# Patient Record
Sex: Male | Born: 1955 | Race: Black or African American | Hispanic: No | Smoking: Former smoker
Health system: Southern US, Community
[De-identification: ages and names within clinical notes are randomized; demographics above are authoritative.]

## PROBLEM LIST (undated history)

## (undated) DIAGNOSIS — I10 Essential (primary) hypertension: Secondary | ICD-10-CM

## (undated) DIAGNOSIS — E119 Type 2 diabetes mellitus without complications: Secondary | ICD-10-CM

## (undated) HISTORY — PX: OTHER SURGICAL HISTORY: SHX169

---

## 2003-06-14 ENCOUNTER — Emergency Department (HOSPITAL_COMMUNITY): Admission: EM | Admit: 2003-06-14 | Discharge: 2003-06-14 | Payer: Self-pay | Admitting: Emergency Medicine

## 2005-07-22 ENCOUNTER — Emergency Department (HOSPITAL_COMMUNITY): Admission: EM | Admit: 2005-07-22 | Discharge: 2005-07-22 | Payer: Self-pay | Admitting: Emergency Medicine

## 2006-10-14 ENCOUNTER — Emergency Department (HOSPITAL_COMMUNITY): Admission: EM | Admit: 2006-10-14 | Discharge: 2006-10-14 | Payer: Self-pay | Admitting: Emergency Medicine

## 2007-04-12 ENCOUNTER — Ambulatory Visit: Payer: Self-pay | Admitting: Gastroenterology

## 2007-07-20 ENCOUNTER — Ambulatory Visit (HOSPITAL_COMMUNITY): Admission: RE | Admit: 2007-07-20 | Discharge: 2007-07-20 | Payer: Self-pay | Admitting: Family Medicine

## 2008-09-19 ENCOUNTER — Emergency Department (HOSPITAL_COMMUNITY): Admission: EM | Admit: 2008-09-19 | Discharge: 2008-09-19 | Payer: Self-pay | Admitting: Emergency Medicine

## 2009-07-28 ENCOUNTER — Emergency Department (HOSPITAL_COMMUNITY): Admission: EM | Admit: 2009-07-28 | Discharge: 2009-07-28 | Payer: Self-pay | Admitting: Emergency Medicine

## 2009-12-07 ENCOUNTER — Emergency Department: Payer: Self-pay | Admitting: Emergency Medicine

## 2010-10-13 LAB — BASIC METABOLIC PANEL
BUN: 16 mg/dL (ref 6–23)
CO2: 26 mEq/L (ref 19–32)
Calcium: 9.5 mg/dL (ref 8.4–10.5)
Chloride: 104 mEq/L (ref 96–112)
Creatinine, Ser: 0.98 mg/dL (ref 0.4–1.5)
GFR calc Af Amer: >60 mL/min (ref >60)
GFR calc non Af Amer: >60 mL/min (ref >60)
Glucose, Bld: 138 mg/dL — ABNORMAL HIGH (ref 70–99)
Potassium: 3.2 mEq/L — ABNORMAL LOW (ref 3.5–5.1)
Sodium: 139 mEq/L (ref 135–145)

## 2010-10-13 LAB — POCT CARDIAC MARKERS
CKMB, poc: 1.7 ng/mL (ref 1.0–8.0)
Myoglobin, poc: 47.6 ng/mL (ref 12–200)
Troponin i, poc: <0.05 ng/mL (ref 0.00–0.09)

## 2010-11-12 LAB — BASIC METABOLIC PANEL
BUN: 14 mg/dL (ref 6–23)
CO2: 25 mEq/L (ref 19–32)
Calcium: 9.4 mg/dL (ref 8.4–10.5)
Chloride: 101 mEq/L (ref 96–112)
Creatinine, Ser: 1.04 mg/dL (ref 0.4–1.5)
GFR calc Af Amer: >60 mL/min (ref >60)
GFR calc non Af Amer: >60 mL/min (ref >60)
Glucose, Bld: 109 mg/dL — ABNORMAL HIGH (ref 70–99)
Potassium: 3.3 mEq/L — ABNORMAL LOW (ref 3.5–5.1)
Sodium: 137 mEq/L (ref 135–145)

## 2010-11-12 LAB — CBC
HCT: 41.5 % (ref 39.0–52.0)
Hemoglobin: 13.6 g/dL (ref 13.0–17.0)
MCHC: 32.7 g/dL (ref 30.0–36.0)
MCV: 71.9 fL — ABNORMAL LOW (ref 78.0–100.0)
Platelets: 190 10*3/uL (ref 150–400)
RBC: 5.78 MIL/uL (ref 4.22–5.81)
RDW: 15.1 % (ref 11.5–15.5)
WBC: 7.3 10*3/uL (ref 4.0–10.5)

## 2010-11-12 LAB — POCT CARDIAC MARKERS
CKMB, poc: 1.6 ng/mL (ref 1.0–8.0)
CKMB, poc: 2.4 ng/mL (ref 1.0–8.0)
Myoglobin, poc: 72.9 ng/mL (ref 12–200)
Myoglobin, poc: 91.5 ng/mL (ref 12–200)
Troponin i, poc: <0.05 ng/mL (ref 0.00–0.09)
Troponin i, poc: <0.05 ng/mL (ref 0.00–0.09)

## 2010-11-12 LAB — DIFFERENTIAL
Basophils Absolute: 0 10*3/uL (ref 0.0–0.1)
Basophils Relative: 0 % (ref 0–1)
Eosinophils Absolute: 0.2 10*3/uL (ref 0.0–0.7)
Eosinophils Relative: 3 % (ref 0–5)
Lymphocytes Relative: 40 % (ref 12–46)
Lymphs Abs: 2.9 10*3/uL (ref 0.7–4.0)
Monocytes Absolute: 0.5 10*3/uL (ref 0.1–1.0)
Monocytes Relative: 7 % (ref 3–12)
Neutro Abs: 3.7 10*3/uL (ref 1.7–7.7)
Neutrophils Relative %: 50 % (ref 43–77)

## 2012-10-24 ENCOUNTER — Emergency Department: Payer: Self-pay | Admitting: Emergency Medicine

## 2012-10-24 LAB — CBC WITH DIFFERENTIAL/PLATELET
Basophil #: 0.2 10*3/uL — ABNORMAL HIGH (ref 0.0–0.1)
Basophil %: 2 %
Eosinophil #: 0.2 10*3/uL (ref 0.0–0.7)
Eosinophil %: 1.6 %
HCT: 43.3 % (ref 40.0–52.0)
HGB: 13.9 g/dL (ref 13.0–18.0)
Lymphocyte #: 2.9 10*3/uL (ref 1.0–3.6)
Lymphocyte %: 28.6 %
MCH: 22.9 pg — ABNORMAL LOW (ref 26.0–34.0)
MCHC: 32.1 g/dL (ref 32.0–36.0)
MCV: 71 fL — ABNORMAL LOW (ref 80–100)
Monocyte #: 0.7 x10 3/mm (ref 0.2–1.0)
Monocyte %: 7.2 %
Neutrophil #: 6.1 10*3/uL (ref 1.4–6.5)
Neutrophil %: 60.6 %
Platelet: 192 10*3/uL (ref 150–440)
RBC: 6.06 10*6/uL — ABNORMAL HIGH (ref 4.40–5.90)
RDW: 15.6 % — ABNORMAL HIGH (ref 11.5–14.5)
WBC: 10 10*3/uL (ref 3.8–10.6)

## 2012-10-24 LAB — COMPREHENSIVE METABOLIC PANEL
Albumin: 4 g/dL (ref 3.4–5.0)
Alkaline Phosphatase: 95 U/L (ref 50–136)
Anion Gap: 7 (ref 7–16)
BUN: 18 mg/dL (ref 7–18)
Bilirubin,Total: 0.6 mg/dL (ref 0.2–1.0)
Calcium, Total: 9.5 mg/dL (ref 8.5–10.1)
Chloride: 104 mmol/L (ref 98–107)
Co2: 27 mmol/L (ref 21–32)
Creatinine: 1.24 mg/dL (ref 0.60–1.30)
EGFR (African American): >60
EGFR (Non-African Amer.): >60
Glucose: 122 mg/dL — ABNORMAL HIGH (ref 65–99)
Osmolality: 279 (ref 275–301)
Potassium: 3.6 mmol/L (ref 3.5–5.1)
SGOT(AST): 23 U/L (ref 15–37)
SGPT (ALT): 30 U/L (ref 12–78)
Sodium: 138 mmol/L (ref 136–145)
Total Protein: 8.2 g/dL (ref 6.4–8.2)

## 2012-10-24 LAB — URIC ACID: Uric Acid: 7.7 mg/dL — ABNORMAL HIGH (ref 3.5–7.2)

## 2014-12-19 ENCOUNTER — Emergency Department
Admission: EM | Admit: 2014-12-19 | Discharge: 2014-12-19 | Disposition: A | Discharge status: Home / Self Care | Payer: Self-pay | Attending: Emergency Medicine | Admitting: Emergency Medicine

## 2014-12-19 ENCOUNTER — Emergency Department: Payer: Self-pay

## 2014-12-19 ENCOUNTER — Encounter: Payer: Self-pay | Admitting: *Deleted

## 2014-12-19 DIAGNOSIS — Z87891 Personal history of nicotine dependence: Secondary | ICD-10-CM | POA: Insufficient documentation

## 2014-12-19 DIAGNOSIS — E119 Type 2 diabetes mellitus without complications: Secondary | ICD-10-CM | POA: Insufficient documentation

## 2014-12-19 DIAGNOSIS — I10 Essential (primary) hypertension: Secondary | ICD-10-CM | POA: Insufficient documentation

## 2014-12-19 DIAGNOSIS — M25551 Pain in right hip: Secondary | ICD-10-CM | POA: Insufficient documentation

## 2014-12-19 DIAGNOSIS — R52 Pain, unspecified: Secondary | ICD-10-CM

## 2014-12-19 DIAGNOSIS — M10071 Idiopathic gout, right ankle and foot: Secondary | ICD-10-CM | POA: Insufficient documentation

## 2014-12-19 DIAGNOSIS — M109 Gout, unspecified: Secondary | ICD-10-CM

## 2014-12-19 HISTORY — DX: Essential (primary) hypertension: I10

## 2014-12-19 HISTORY — DX: Type 2 diabetes mellitus without complications: E11.9

## 2014-12-19 LAB — BASIC METABOLIC PANEL
Anion gap: 10 (ref 5–15)
BUN: 13 mg/dL (ref 6–20)
CO2: 26 mmol/L (ref 22–32)
Calcium: 9.3 mg/dL (ref 8.9–10.3)
Chloride: 100 mmol/L — ABNORMAL LOW (ref 101–111)
Creatinine, Ser: 1.13 mg/dL (ref 0.61–1.24)
GFR calc Af Amer: >60 mL/min (ref >60)
GFR calc non Af Amer: >60 mL/min (ref >60)
Glucose, Bld: 108 mg/dL — ABNORMAL HIGH (ref 65–99)
Potassium: 3.5 mmol/L (ref 3.5–5.1)
Sodium: 136 mmol/L (ref 135–145)

## 2014-12-19 LAB — URINALYSIS COMPLETE WITH MICROSCOPIC (ARMC ONLY)
Bacteria, UA: NONE SEEN
Bilirubin Urine: NEGATIVE
Glucose, UA: NEGATIVE mg/dL
Hgb urine dipstick: NEGATIVE
Ketones, ur: NEGATIVE mg/dL
Leukocytes, UA: NEGATIVE
Nitrite: NEGATIVE
Protein, ur: NEGATIVE mg/dL
Specific Gravity, Urine: 1.012 (ref 1.005–1.030)
Squamous Epithelial / LPF: NONE SEEN
pH: 7 (ref 5.0–8.0)

## 2014-12-19 LAB — CBC WITH DIFFERENTIAL/PLATELET
Basophils Absolute: 0.1 10*3/uL (ref 0–0.1)
Basophils Relative: 1 %
Eosinophils Absolute: 0.1 10*3/uL (ref 0–0.7)
Eosinophils Relative: 1 %
HCT: 43.3 % (ref 40.0–52.0)
Hemoglobin: 13.9 g/dL (ref 13.0–18.0)
Lymphocytes Relative: 24 %
Lymphs Abs: 2.7 10*3/uL (ref 1.0–3.6)
MCH: 22.7 pg — ABNORMAL LOW (ref 26.0–34.0)
MCHC: 32 g/dL (ref 32.0–36.0)
MCV: 71 fL — ABNORMAL LOW (ref 80.0–100.0)
Monocytes Absolute: 0.8 10*3/uL (ref 0.2–1.0)
Monocytes Relative: 7 %
Neutro Abs: 7.9 10*3/uL — ABNORMAL HIGH (ref 1.4–6.5)
Neutrophils Relative %: 67 %
Platelets: 160 10*3/uL (ref 150–440)
RBC: 6.1 MIL/uL — ABNORMAL HIGH (ref 4.40–5.90)
RDW: 14.8 % — ABNORMAL HIGH (ref 11.5–14.5)
WBC: 11.6 10*3/uL — ABNORMAL HIGH (ref 3.8–10.6)

## 2014-12-19 LAB — URIC ACID: Uric Acid, Serum: 7.4 mg/dL (ref 4.4–7.6)

## 2014-12-19 MED ORDER — HYDROCODONE-ACETAMINOPHEN 5-325 MG PO TABS: 1.0000 | ORAL_TABLET | ORAL | Status: DC | PRN

## 2014-12-19 MED ORDER — IBUPROFEN 800 MG PO TABS: 800.0000 mg | ORAL_TABLET | Freq: Three times a day (TID) | ORAL | Status: DC | PRN

## 2014-12-19 MED ORDER — KETOROLAC TROMETHAMINE 30 MG/ML IJ SOLN
30.0000 mg | Freq: Once | INTRAMUSCULAR | Status: AC
  Administered 2014-12-19: 30 mg via INTRAVENOUS

## 2014-12-19 MED ORDER — DEXAMETHASONE SODIUM PHOSPHATE 10 MG/ML IJ SOLN
10.0000 mg | Freq: Once | INTRAMUSCULAR | Status: AC
  Administered 2014-12-19: 10 mg via INTRAVENOUS

## 2014-12-19 MED ORDER — COLCHICINE 0.6 MG PO TABS: 0.6000 mg | ORAL_TABLET | Freq: Every day | ORAL | Status: DC

## 2014-12-19 MED ORDER — KETOROLAC TROMETHAMINE 30 MG/ML IJ SOLN
INTRAMUSCULAR | Status: AC
  Administered 2014-12-19: 30 mg via INTRAVENOUS
  Filled 2014-12-19: qty 1

## 2014-12-19 MED ORDER — DEXAMETHASONE SODIUM PHOSPHATE 10 MG/ML IJ SOLN
INTRAMUSCULAR | Status: AC
  Filled 2014-12-19: qty 1

## 2014-12-19 MED ORDER — HYDROMORPHONE HCL 1 MG/ML IJ SOLN
INTRAMUSCULAR | Status: AC
  Administered 2014-12-19: 1 mg via INTRAVENOUS
  Filled 2014-12-19: qty 1

## 2014-12-19 MED ORDER — SODIUM CHLORIDE 0.9 % IV BOLUS (SEPSIS)
1000.0000 mL | Freq: Once | INTRAVENOUS | Status: AC
  Administered 2014-12-19: 1000 mL via INTRAVENOUS

## 2014-12-19 MED ORDER — COLCHICINE 0.6 MG PO TABS
0.6000 mg | ORAL_TABLET | Freq: Once | ORAL | Status: AC
  Administered 2014-12-19: 0.6 mg via ORAL

## 2014-12-19 MED ORDER — COLCHICINE 0.6 MG PO TABS
ORAL_TABLET | ORAL | Status: AC
  Administered 2014-12-19: 0.6 mg via ORAL
  Filled 2014-12-19: qty 1

## 2014-12-19 MED ORDER — HYDROMORPHONE HCL 1 MG/ML IJ SOLN
1.0000 mg | Freq: Once | INTRAMUSCULAR | Status: AC
  Administered 2014-12-19: 1 mg via INTRAVENOUS

## 2014-12-19 NOTE — ED Notes (Signed)
Few days ago developed pain and swelling in right ankle then developed pain in right groin, #plses

## 2014-12-19 NOTE — ED Provider Notes (Signed)
CSN: [id number]     Arrival date & time 12/19/14  [id number] History   First MD Initiated Contact with Patient 12/19/14 1030     Chief Complaint  Patient presents with  . Ankle Pain    few days ago developed pain and swelling to right ankle, then developed pain right groin     (Consider location/radiation/quality/duration/timing/severity/associated sxs/prior Treatment) HPI Patient complaining of gout flareup in his right ankle also having some right hip pain states that he had a lot of meat and some beers this weekend said he started having pain in his ankle tried to stay off of it but it kept swelling and inflaming and is here today for treatment of a gouty flareup rates his pain as a 10 out of 10 any type of movement or touch making it worse holding and steady making it somewhat better denies fevers chills trauma any other symptoms of note Past Medical History  Diagnosis Date  . Hypertension   . Diabetes mellitus without complication    Past Surgical History  Procedure Laterality Date  . Other surgical history      had clogged artery in right leg   No family history on file. History  Substance Use Topics  . Smoking status: Former Games developermoker  . Smokeless tobacco: Not on file  . Alcohol Use: Yes    Review of Systems  Constitutional: Negative.   HENT: Negative.   Eyes: Negative.   Respiratory: Negative.   Cardiovascular: Negative.   Neurological: Negative.   Hematological: Negative.   All other systems reviewed and are negative.     Allergies  Review of patient's allergies indicates no known allergies.  Home Medications   Prior to Admission medications   Medication Sig Start Date End Date Taking? Authorizing Provider  colchicine 0.6 MG tablet Take 1 tablet (0.6 mg total) by mouth daily. 12/19/14 12/19/15  Marcellene Shivley William C Esraa Seres, PA-C  HYDROcodone-acetaminophen (NORCO/VICODIN) 5-325 MG per tablet Take 1 tablet by mouth every 4 (four) hours as needed for moderate pain. 12/19/14    Meshulem Onorato William C Kree Armato, PA-C  ibuprofen (ADVIL,MOTRIN) 800 MG tablet Take 1 tablet (800 mg total) by mouth every 8 (eight) hours as needed. 12/19/14   Nevada Mullett William C Emelina Hinch, PA-C   BP 150/98 mmHg  Pulse 91  Temp(Src) 98.1 F (36.7 C) (Oral)  Resp 16  Ht 5\' 6"  (1.676 m)  Wt 198 lb (89.812 kg)  BMI 31.97 kg/m2  SpO2 99% Physical Exam The male he is appearing stated age well-developed well-nourished lying in bed in some discomfort but no acute distress Vital signs reviewed Head ears eyes nose neck and throat exam were negative pupils equal round reactive light accommodation extraocular motions intact Cardiovascular regular rate and rhythm no murmurs rubs gallops Pulmonary lungs clear to auscultation bilaterally Abdomen soft flat nontender bowel sounds positive in all 4 quadrants Skin otherwise free of rash or disease Neuro exam nonfocal cranial nerves II through XII grossly intact Musculoskeletal patient has pain with palpation to the lateral aspect of his ankle around the distal fibula with mild swelling to that area no redness no sign of any wound good cap refill good pulses throughout the foot pain with external rotation of the leg at the hip without crepitus or palpable deformity or abnormality ED Course  Procedures (including critical care time) Labs Review Labs Reviewed  URINALYSIS COMPLETEWITH MICROSCOPIC [facility])  - Abnormal; Notable for the following:    Color, Urine YELLOW (*)    APPearance CLEAR (*)  All other components within normal limits  CBC WITH DIFFERENTIAL/PLATELET - Abnormal; Notable for the following:    WBC 11.6 (*)    RBC 6.10 (*)    MCV 71.0 (*)    MCH 22.7 (*)    RDW 14.8 (*)    Neutro Abs 7.9 (*)    All other components within normal limits  BASIC METABOLIC PANEL - Abnormal; Notable for the following:    Chloride 100 (*)    Glucose, Bld 108 (*)    All other components within normal limits  URIC ACID    Imaging Review Dg Hip Unilat With Pelvis 2-3  Views Right  12/19/2014   CLINICAL DATA:  Pain in right groin.  No known injury.  EXAM: RIGHT HIP (WITH PELVIS) 2-3 VIEWS  COMPARISON:  None.  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 Findings are symmetric. SI joints are symmetric and unremarkable. No acute bony abnormality. Specifically, no fracture, subluxation, or dislocation. Soft tissues are intact.  IMPRESSION: Mild to moderate symmetric degenerative changes in the hips. No acute bony abnormality.   Electronically Signed   By: Charlett Nose M.D.   On: 12/19/2014 11:15     EKG Interpretation None      MDM  This patient was given colchicine Toradol Dilaudid and Decadron in the department with relief of symptoms is feeling much better x-ray of his hip showed degenerative changes but otherwise no acute abnormality oriented discharge the patient on NSAIDs pain management and culture seen for the next couple days recommended he follow up with his primary care provider to discuss abortive therapy he was also given information on a low purine diet return here for any acute concerns or worsening symptoms Final diagnoses:  Pain  Acute gout of right ankle, unspecified cause        Edmund Holcomb Rosalyn Gess, PA-C 12/19/14 1403

## 2014-12-19 NOTE — ED Notes (Signed)
Pt alert and oriented X4, active, cooperative, pt in NAD. RR even and unlabored, color WNL.  Pt informed to return if any life threatening symptoms occur.  Left with family.  

## 2015-03-09 ENCOUNTER — Emergency Department
Admission: EM | Admit: 2015-03-09 | Discharge: 2015-03-09 | Disposition: A | Discharge status: Home / Self Care | Payer: Self-pay | Attending: Emergency Medicine | Admitting: Emergency Medicine

## 2015-03-09 ENCOUNTER — Encounter: Payer: Self-pay | Admitting: Emergency Medicine

## 2015-03-09 ENCOUNTER — Emergency Department: Payer: Self-pay

## 2015-03-09 DIAGNOSIS — Z87891 Personal history of nicotine dependence: Secondary | ICD-10-CM | POA: Insufficient documentation

## 2015-03-09 DIAGNOSIS — I10 Essential (primary) hypertension: Secondary | ICD-10-CM | POA: Insufficient documentation

## 2015-03-09 DIAGNOSIS — M7918 Myalgia, other site: Secondary | ICD-10-CM

## 2015-03-09 DIAGNOSIS — M545 Low back pain: Secondary | ICD-10-CM | POA: Insufficient documentation

## 2015-03-09 DIAGNOSIS — E119 Type 2 diabetes mellitus without complications: Secondary | ICD-10-CM | POA: Insufficient documentation

## 2015-03-09 MED ORDER — CYCLOBENZAPRINE HCL 10 MG PO TABS: 10.0000 mg | ORAL_TABLET | Freq: Three times a day (TID) | ORAL | Status: DC | PRN

## 2015-03-09 MED ORDER — DIAZEPAM 5 MG/ML IJ SOLN
5.0000 mg | Freq: Once | INTRAMUSCULAR | Status: AC
  Administered 2015-03-09: 5 mg via INTRAMUSCULAR
  Filled 2015-03-09: qty 2

## 2015-03-09 MED ORDER — HYDROCODONE-ACETAMINOPHEN 5-325 MG PO TABS: 1.0000 | ORAL_TABLET | ORAL | Status: DC | PRN

## 2015-03-09 MED ORDER — KETOROLAC TROMETHAMINE 60 MG/2ML IM SOLN
60.0000 mg | Freq: Once | INTRAMUSCULAR | Status: AC
  Administered 2015-03-09: 60 mg via INTRAMUSCULAR
  Filled 2015-03-09: qty 2

## 2015-03-09 MED ORDER — IBUPROFEN 800 MG PO TABS: 800.0000 mg | ORAL_TABLET | Freq: Three times a day (TID) | ORAL | Status: DC | PRN

## 2015-03-09 NOTE — ED Provider Notes (Signed)
[facility] Emergency Department Provider Note  ____________________________________________  Time seen: Approximately 12:34 PM  I have reviewed the triage vital signs and the nursing notes.   HISTORY  Chief Complaint Back Pain   HPI Randy Marsh is a 59 y.o. male who presents for evaluation of low back pain and spasms onset today. Patient states difficult to ambulate at this time. Denies any trauma  Past Medical History  Diagnosis Date  . Hypertension   . Diabetes mellitus without complication     There are no active problems to display for this patient.   Past Surgical History  Procedure Laterality Date  . Other surgical history      had clogged artery in right leg    Current Outpatient Rx  Name  Route  Sig  Dispense  Refill  . cyclobenzaprine (FLEXERIL) 10 MG tablet   Oral   Take 1 tablet (10 mg total) by mouth every 8 (eight) hours as needed for muscle spasms.   30 tablet   1   . HYDROcodone-acetaminophen (NORCO) 5-325 MG per tablet   Oral   Take 1-2 tablets by mouth every 4 (four) hours as needed for moderate pain.   15 tablet   0   . ibuprofen (ADVIL,MOTRIN) 800 MG tablet   Oral   Take 1 tablet (800 mg total) by mouth every 8 (eight) hours as needed.   30 tablet   0     Allergies Review of patient's allergies indicates no known allergies.  History reviewed. No pertinent family history.  Social History Social History  Substance Use Topics  . Smoking status: Former Games developer  . Smokeless tobacco: None  . Alcohol Use: Yes    Review of Systems Constitutional: No fever/chills Eyes: No visual changes. ENT: No sore throat. Cardiovascular: Denies chest pain. Respiratory: Denies shortness of breath. Gastrointestinal: No abdominal pain.  No nausea, no vomiting.  No diarrhea.  No constipation. Genitourinary: Negative for dysuria. Musculoskeletal: Positive for low back pain Skin: Negative for rash. Neurological: Negative  for headaches, focal weakness or numbness.  10-point ROS otherwise negative.  ____________________________________________   PHYSICAL EXAM:  VITAL SIGNS: ED Triage Vitals  Enc Vitals Group     BP 03/09/15 1123 158/98 mmHg     Pulse Rate 03/09/15 1123 85     Resp 03/09/15 1123 18     Temp 03/09/15 1123 98.2 F (36.8 C)     Temp Source 03/09/15 1123 Oral     SpO2 03/09/15 1123 98 %     Weight 03/09/15 1123 187 lb (84.823 kg)     Height 03/09/15 1123 5\' 6"  (1.676 m)     Head Cir --      Peak Flow --      Pain Score 03/09/15 1121 10     Pain Loc --      Pain Edu? --      Excl. in GC? --     Constitutional: Alert and oriented. Well appearing and in no acute distress. Eyes: Conjunctivae are normal. PERRL. EOMI. Head: Atraumatic. Nose: No congestion/rhinnorhea. Mouth/Throat: Mucous membranes are moist.  Oropharynx non-erythematous. Neck: No stridor.   Cardiovascular: Normal rate, regular rhythm. Grossly normal heart sounds.  Good peripheral circulation. Respiratory: Normal respiratory effort.  No retractions. Lungs CTAB. Gastrointestinal: Soft and nontender. No distention. No abdominal bruits. No CVA tenderness. Musculoskeletal: No lower extremity tenderness nor edema.  No joint effusions. Positive straight leg raise 30 bilaterally increased pain with flexion and extension. Point  tenderness paraspinal muscles and lumbar spine Neurologic:  Normal speech and language. No gross focal neurologic deficits are appreciated. No gait instability. Skin:  Skin is warm, dry and intact. No rash noted. Psychiatric: Mood and affect are normal. Speech and behavior are normal.  ____________________________________________   LABS (all labs ordered are listed, but only abnormal results are displayed)  Labs Reviewed - No data to display ____________________________________________   RADIOLOGY  Negative interpreted by radiologist and reviewed by  myself. ____________________________________________   PROCEDURES  Procedure(s) performed: None  Critical Care performed: No  ____________________________________________   INITIAL IMPRESSION / ASSESSMENT AND PLAN / ED COURSE  Pertinent labs & imaging results that were available during my care of the patient were reviewed by me and considered in my medical decision making (see chart for details).  Diagnosed with acute lumbar myofascial strain. Rx given for Motrin 800 mg 3 times a day and Flexeril 10 mg 3 times a day. Take all medications as directed continue using heating pad and rest. Patient voices no other emergency medical complaints at this time and will return to the ER with any worsening symptomology. ____________________________________________   FINAL CLINICAL IMPRESSION(S) / ED DIAGNOSES  Final diagnoses:  Lumbar muscle pain      Evangeline Dakin, PA-C 03/09/15 1446  Sharyn Creamer, MD 03/09/15 1549

## 2015-03-09 NOTE — ED Notes (Signed)
Pt to ed with c/o lower back pain, denies injury, reports hx of same,  States ambulation is difficult.

## 2015-03-09 NOTE — Discharge Instructions (Signed)

## 2017-08-18 ENCOUNTER — Encounter: Payer: Self-pay | Admitting: *Deleted

## 2017-08-18 ENCOUNTER — Ambulatory Visit (INDEPENDENT_AMBULATORY_CARE_PROVIDER_SITE_OTHER): Payer: Self-pay

## 2017-08-18 ENCOUNTER — Ambulatory Visit
Admission: EM | Admit: 2017-08-18 | Discharge: 2017-08-18 | Disposition: A | Discharge status: Home / Self Care | Payer: Self-pay | Attending: Family Medicine | Admitting: Family Medicine

## 2017-08-18 ENCOUNTER — Other Ambulatory Visit: Payer: Self-pay

## 2017-08-18 DIAGNOSIS — M778 Other enthesopathies, not elsewhere classified: Secondary | ICD-10-CM

## 2017-08-18 DIAGNOSIS — M25531 Pain in right wrist: Secondary | ICD-10-CM

## 2017-08-18 DIAGNOSIS — M79631 Pain in right forearm: Secondary | ICD-10-CM

## 2017-08-18 MED ORDER — PREDNISONE 20 MG PO TABS: 20.0000 mg | ORAL_TABLET | Freq: Every day | ORAL | 0 refills | Status: DC

## 2017-08-18 MED ORDER — ACETAMINOPHEN 500 MG PO TABS
1000.0000 mg | ORAL_TABLET | Freq: Once | ORAL | Status: AC
  Administered 2017-08-18: 1000 mg via ORAL

## 2017-08-18 MED ORDER — CLONIDINE HCL 0.1 MG PO TABS
0.1000 mg | ORAL_TABLET | Freq: Once | ORAL | Status: AC
  Administered 2017-08-18: 0.1 mg via ORAL

## 2017-08-18 MED ORDER — HYDROCODONE-ACETAMINOPHEN 5-325 MG PO TABS: ORAL_TABLET | ORAL | 0 refills | Status: DC

## 2017-08-18 NOTE — ED Provider Notes (Signed)
[facility]    CSN: [id number] Arrival date & time: 08/18/17  [id number]     History   Chief Complaint Chief Complaint  Patient presents with  . Wrist Injury    HPI Randy Marsh is a 62 y.o. male.   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 Denies any falls or direct trauma, however states he was holding the heavy transmission with wrist extended for a prolong period of time.    The history is provided by the patient.    Past Medical History:  Diagnosis Date  . Diabetes mellitus without complication (HCC)   . Hypertension     There are no active problems to display for this patient.   Past Surgical History:  Procedure Laterality Date  . OTHER SURGICAL HISTORY     had clogged artery in right leg       Home Medications    Prior to Admission medications   Medication Sig Start Date End Date Taking? Authorizing Provider  cyclobenzaprine (FLEXERIL) 10 MG tablet Take 1 tablet (10 mg total) by mouth every 8 (eight) hours as needed for muscle spasms. 03/09/15   Beers, Charmayne Sheerharles M, PA-C  HYDROcodone-acetaminophen (NORCO/VICODIN) 5-325 MG tablet 1-2 tabs po q 8 hours prn 08/18/17   Payton Mccallumonty, Kairen Hallinan, MD  ibuprofen (ADVIL,MOTRIN) 800 MG tablet Take 1 tablet (800 mg total) by mouth every 8 (eight) hours as needed. 03/09/15   Beers, Charmayne Sheerharles M, PA-C  predniSONE (DELTASONE) 20 MG tablet Take 1 tablet (20 mg total) by mouth daily. 08/18/17   Payton Mccallumonty, Wali Reinheimer, MD    Family History Family History  Problem Relation Age of Onset  . Cancer Father     Social History Social History   Tobacco Use  . Smoking status: Former Games developermoker  . Smokeless tobacco: Never Used  Substance Use Topics  . Alcohol use: Yes  . Drug use: No     Allergies   Patient has no known allergies.   Review of Systems Review of Systems   Physical Exam Triage Vital Signs ED Triage Vitals  Enc Vitals Group     BP 08/18/17  0918 (!) 196/125     Pulse Rate 08/18/17 0918 82     Resp 08/18/17 0918 20     Temp 08/18/17 0918 97.8 F (36.6 C)     Temp Source 08/18/17 0918 Oral     SpO2 08/18/17 0918 99 %     Weight 08/18/17 0919 198 lb (89.8 kg)     Height 08/18/17 0919 5\' 6"  (1.676 m)     Head Circumference --      Peak Flow --      Pain Score 08/18/17 0918 10     Pain Loc --      Pain Edu? --      Excl. in GC? --    No data found.  Updated Vital Signs BP (!) 186/113 (BP Location: Left Arm) Comment: Dr Judd Gaudieronty notified  Pulse 80   Temp 97.8 F (36.6 C) (Oral)   Resp 20   Ht 5\' 6"  (1.676 m)   Wt 198 lb (89.8 kg)   SpO2 99%   BMI 31.96 kg/m   Visual Acuity Right Eye Distance:   Left Eye Distance:   Bilateral Distance:    Right Eye Near:   Left Eye Near:    Bilateral Near:     Physical Exam  Constitutional: He appears  well-developed and well-nourished. No distress.  Musculoskeletal:       Right hand: He exhibits decreased range of motion, tenderness and swelling. He exhibits normal two-point discrimination, normal capillary refill, no deformity and no laceration. Normal sensation noted. Normal strength noted.  Skin: He is not diaphoretic.  Nursing note and vitals reviewed.    UC Treatments / Results  Labs (all labs ordered are listed, but only abnormal results are displayed) Labs Reviewed - No data to display  EKG  EKG Interpretation None       Radiology Dg Wrist Complete Right  Result Date: 08/18/2017 CLINICAL DATA:  Right wrist pain EXAM: RIGHT WRIST - COMPLETE 3+ VIEW COMPARISON:  None. FINDINGS: No fracture or dislocation is seen. Degenerative changes of the radiocarpal joint, including subchondral cystic changes. Calcifications of the TFCC. Mild diffuse soft tissue swelling. IMPRESSION: Degenerative changes at the radiocarpal joint. No fracture or dislocation is seen. Electronically Signed   By: Charline Bills M.D.   On: 08/18/2017 10:04    Procedures Procedures  (including critical care time)  Medications Ordered in UC Medications  acetaminophen (TYLENOL) tablet 1,000 mg (1,000 mg Oral Given 08/18/17 0931)  cloNIDine (CATAPRES) tablet 0.1 mg (0.1 mg Oral Given 08/18/17 1028)     Initial Impression / Assessment and Plan / UC Course  I have reviewed the triage vital signs and the nursing notes.  Pertinent labs & imaging results that were available during my care of the patient were reviewed by me and considered in my medical decision making (see chart for details).        Final Clinical Impressions(s) / UC Diagnoses   Final diagnoses:  Tendinitis of right wrist    ED Discharge Orders        Ordered    predniSONE (DELTASONE) 20 MG tablet  Daily     08/18/17 1025    HYDROcodone-acetaminophen (NORCO/VICODIN) 5-325 MG tablet     08/18/17 1025     1. x-ray results and diagnosis reviewed with patient 2. rx as per orders above; reviewed possible side effects, interactions, risks and benefits  3. Recommend supportive treatment with rest, ice, otc acetaminophen 4. Follow-up prn if symptoms worsen or don't improve  Controlled Substance Prescriptions [place] Controlled Substance Registry consulted? Not Applicable   Payton Mccallum, MD 08/18/17 1328

## 2017-08-18 NOTE — ED Triage Notes (Signed)
Patient started having right wrist pain 5 days ago after assisting friend with installing a transmission. Deformity is visible posterior right wrist. Patient reports breaking right wrist when he was in [employer].

## 2017-08-21 ENCOUNTER — Telehealth: Payer: Self-pay | Admitting: *Deleted

## 2017-09-14 ENCOUNTER — Other Ambulatory Visit: Payer: Self-pay

## 2017-09-14 ENCOUNTER — Encounter: Payer: Self-pay | Admitting: Emergency Medicine

## 2017-09-14 ENCOUNTER — Emergency Department
Admission: EM | Admit: 2017-09-14 | Discharge: 2017-09-14 | Disposition: A | Discharge status: Home / Self Care | Payer: Self-pay | Attending: Emergency Medicine | Admitting: Emergency Medicine

## 2017-09-14 ENCOUNTER — Emergency Department: Payer: Self-pay

## 2017-09-14 DIAGNOSIS — Y929 Unspecified place or not applicable: Secondary | ICD-10-CM | POA: Insufficient documentation

## 2017-09-14 DIAGNOSIS — E119 Type 2 diabetes mellitus without complications: Secondary | ICD-10-CM | POA: Insufficient documentation

## 2017-09-14 DIAGNOSIS — Y999 Unspecified external cause status: Secondary | ICD-10-CM | POA: Insufficient documentation

## 2017-09-14 DIAGNOSIS — S9032XA Contusion of left foot, initial encounter: Secondary | ICD-10-CM | POA: Insufficient documentation

## 2017-09-14 DIAGNOSIS — Z87891 Personal history of nicotine dependence: Secondary | ICD-10-CM | POA: Insufficient documentation

## 2017-09-14 DIAGNOSIS — I10 Essential (primary) hypertension: Secondary | ICD-10-CM | POA: Insufficient documentation

## 2017-09-14 DIAGNOSIS — Y939 Activity, unspecified: Secondary | ICD-10-CM | POA: Insufficient documentation

## 2017-09-14 DIAGNOSIS — X58XXXA Exposure to other specified factors, initial encounter: Secondary | ICD-10-CM | POA: Insufficient documentation

## 2017-09-14 MED ORDER — IBUPROFEN 600 MG PO TABS: 600.0000 mg | ORAL_TABLET | Freq: Three times a day (TID) | ORAL | 0 refills | Status: DC | PRN

## 2017-09-14 MED ORDER — OXYCODONE-ACETAMINOPHEN 5-325 MG PO TABS
1.0000 | ORAL_TABLET | Freq: Once | ORAL | Status: AC
  Administered 2017-09-14: 1 via ORAL
  Filled 2017-09-14: qty 1

## 2017-09-14 MED ORDER — OXYCODONE-ACETAMINOPHEN 7.5-325 MG PO TABS: 1.0000 | ORAL_TABLET | Freq: Four times a day (QID) | ORAL | 0 refills | Status: DC | PRN

## 2017-09-14 MED ORDER — IBUPROFEN 800 MG PO TABS
800.0000 mg | ORAL_TABLET | Freq: Once | ORAL | Status: AC
  Administered 2017-09-14: 800 mg via ORAL
  Filled 2017-09-14: qty 1
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
## 2020-02-15 ENCOUNTER — Ambulatory Visit
Admission: RE | Admit: 2020-02-15 | Discharge: 2020-02-15 | Disposition: A | Discharge status: Home / Self Care | Payer: Medicare Other | Source: Ambulatory Visit | Attending: Family Medicine | Admitting: Family Medicine

## 2020-02-15 ENCOUNTER — Ambulatory Visit
Admission: RE | Admit: 2020-02-15 | Discharge: 2020-02-15 | Disposition: A | Discharge status: Home / Self Care | Payer: Medicare Other | Attending: Family Medicine | Admitting: Family Medicine

## 2020-02-15 ENCOUNTER — Other Ambulatory Visit: Payer: Self-pay | Admitting: Family Medicine

## 2020-02-15 DIAGNOSIS — M25562 Pain in left knee: Secondary | ICD-10-CM

## 2020-07-12 DIAGNOSIS — M1712 Unilateral primary osteoarthritis, left knee: Secondary | ICD-10-CM | POA: Insufficient documentation

## 2021-04-04 ENCOUNTER — Emergency Department
Admission: EM | Admit: 2021-04-04 | Discharge: 2021-04-04 | Disposition: A | Discharge status: Home / Self Care | Payer: Medicare Other | Attending: Emergency Medicine | Admitting: Emergency Medicine

## 2021-04-04 ENCOUNTER — Other Ambulatory Visit: Payer: Self-pay

## 2021-04-04 DIAGNOSIS — E119 Type 2 diabetes mellitus without complications: Secondary | ICD-10-CM | POA: Insufficient documentation

## 2021-04-04 DIAGNOSIS — Z7984 Long term (current) use of oral hypoglycemic drugs: Secondary | ICD-10-CM | POA: Insufficient documentation

## 2021-04-04 DIAGNOSIS — M25462 Effusion, left knee: Secondary | ICD-10-CM

## 2021-04-04 DIAGNOSIS — M25562 Pain in left knee: Secondary | ICD-10-CM | POA: Diagnosis present

## 2021-04-04 DIAGNOSIS — Z87891 Personal history of nicotine dependence: Secondary | ICD-10-CM | POA: Insufficient documentation

## 2021-04-04 DIAGNOSIS — M1712 Unilateral primary osteoarthritis, left knee: Secondary | ICD-10-CM | POA: Diagnosis not present

## 2021-04-04 DIAGNOSIS — I10 Essential (primary) hypertension: Secondary | ICD-10-CM | POA: Diagnosis not present

## 2021-04-04 DIAGNOSIS — Z96652 Presence of left artificial knee joint: Secondary | ICD-10-CM | POA: Insufficient documentation

## 2021-04-04 DIAGNOSIS — Z79899 Other long term (current) drug therapy: Secondary | ICD-10-CM | POA: Insufficient documentation

## 2021-04-04 MED ORDER — HYDROCODONE-ACETAMINOPHEN 5-325 MG PO TABS
1.0000 | ORAL_TABLET | Freq: Once | ORAL | Status: AC
  Administered 2021-04-04: 1 via ORAL
  Filled 2021-04-04: qty 1

## 2021-04-04 MED ORDER — HYDROCODONE-ACETAMINOPHEN 5-325 MG PO TABS: 1.0000 | ORAL_TABLET | Freq: Three times a day (TID) | ORAL | 0 refills | Status: DC | PRN

## 2021-04-04 MED ORDER — NABUMETONE 750 MG PO TABS: 750.0000 mg | ORAL_TABLET | Freq: Two times a day (BID) | ORAL | 0 refills | Status: AC

## 2021-04-04 NOTE — ED Notes (Addendum)
See triage note  presents with swelling to left knee  denies any recent injury    states his surgery is scheduled for this month at [facility]

## 2021-04-04 NOTE — ED Notes (Signed)
Patient stable and discharged with all personal belongings and AVS. AVS and discharge instructions reviewed with patient and opportunity for questions provided.   Walker provided to patient and adjusted to height per MD order.

## 2021-04-04 NOTE — ED Provider Notes (Signed)
[facility] Emergency Department Provider Note ____________________________________________  Time seen: 1123  I have reviewed the triage vital signs and the nursing notes.  HISTORY  Chief Complaint  Leg Swelling   HPI Randy Marsh is a 65 y.o. male with below medical history, presents to the ED for evaluation of acute on chronic left knee pain.  Patient with a history of significant tricompartmental osteoarthritis of the left knee, presents with acute swelling.  Patient is scheduled for a total knee replacement in about 3 weeks.  Denies any preceding injury, trauma, or fall.  He notes acute swelling to the joint last night.  He has taken Tylenol and ibuprofen with limited benefit.  He presents to the ED requesting management of his pain.  Past Medical History:  Diagnosis Date   Diabetes mellitus without complication (HCC)    Hypertension     There are no problems to display for this patient.   Past Surgical History:  Procedure Laterality Date   OTHER SURGICAL HISTORY     had clogged artery in right leg    Prior to Admission medications   Medication Sig Start Date End Date Taking? Authorizing Provider  amLODipine (NORVASC) 10 MG tablet Take 10 mg by mouth daily.   Yes [provider]  atorvastatin (LIPITOR) 40 MG tablet Take 40 mg by mouth daily.   Yes [provider]  HYDROcodone-acetaminophen (NORCO) 5-325 MG tablet Take 1 tablet by mouth 3 (three) times daily as needed. 04/04/21  Yes Artia Singley, Charlesetta Ivory, PA-C  lisinopril (ZESTRIL) 20 MG tablet Take 20 mg by mouth daily.   Yes [provider]  metFORMIN (GLUCOPHAGE) 500 MG tablet Take by mouth 2 (two) times daily with a meal.   Yes [provider]  nabumetone (RELAFEN) 750 MG tablet Take 1 tablet (750 mg total) by mouth 2 (two) times daily for 15 days. 04/04/21 04/19/21 Yes Devlyn Parish, Charlesetta Ivory, PA-C    Allergies Patient has no known allergies.  Family History   Problem Relation Age of Onset   Cancer Father     Social History Social History   Tobacco Use   Smoking status: Former   Smokeless tobacco: Never  Building services engineer Use: Never used  Substance Use Topics   Alcohol use: Yes   Drug use: No    Review of Systems  Constitutional: Negative for fever. Cardiovascular: Negative for chest pain. Respiratory: Negative for shortness of breath. Gastrointestinal: Negative for abdominal pain, vomiting and diarrhea. Genitourinary: Negative for dysuria. Musculoskeletal: Negative for back pain.  Left knee pain as above. Skin: Negative for rash. Neurological: Negative for headaches, focal weakness or numbness. ____________________________________________  PHYSICAL EXAM:  VITAL SIGNS: ED Triage Vitals [04/04/21 1105]  Enc Vitals Group     BP (!) 120/99     Pulse Rate 83     Resp 20     Temp 98.2 F (36.8 C)     Temp Source Oral     SpO2 98 %     Weight 197 lb (89.4 kg)     Height 5\' 6"  (1.676 m)     Head Circumference      Peak Flow      Pain Score 9     Pain Loc      Pain Edu?      Excl. in GC?     Constitutional: Alert and oriented. Well appearing and in no distress. Head: Normocephalic and atraumatic. Eyes: Conjunctivae are normal. Normal  extraocular movements Cardiovascular: Normal rate, regular rhythm. Normal distal pulses. Respiratory: Normal respiratory effort. No wheezes/rales/rhonchi. Musculoskeletal: Left knee with moderate joint effusion appreciated.  Patient with decreased range of motion secondary to joint effusion.  No popliteal space fullness is appreciated.  No calf or Achilles tenderness noted distally.  Nontender with normal range of motion in all extremities.  Neurologic: Antalgic gait without ataxia. Normal speech and language. No gross focal neurologic deficits are appreciated. Skin:  Skin is warm, dry and intact. No rash noted. Psychiatric: Mood and affect are normal. Patient exhibits appropriate insight  and judgment. ____________________________________________    {LABS (pertinent positives/negatives)  ____________________________________________  {EKG  ____________________________________________   RADIOLOGY Official radiology report(s): No results found. ____________________________________________  PROCEDURES  Norco 5-325 mg PO Standard walker  Procedures ____________________________________________   INITIAL IMPRESSION / ASSESSMENT AND PLAN / ED COURSE  As part of my medical decision making, I reviewed the following data within the electronic MEDICAL RECORD NUMBER Old chart reviewed, Notes from prior ED visits, and [place] Controlled Substance Database   Patient with significant left knee DJD, presents with an acute effusion.  Patient without any preceding injury,, presents with acute on chronic knee pain.  He scheduled for total knee replacement in 3 weeks.  Patient requested, but was declined a joint aspiration given the increased risks prior to his planned surgery.  He is discharged at this time with a prescription pain medicine as well as an anti-inflammatory.  He should follow with primary provider for ongoing symptom management.  Work was also provided for his ambulatory benefit.  He should discontinue all anti-inflammatories 3 to 5 days prior to his scheduled surgery.  Return precautions have been reviewed.    Randy Marsh was evaluated in Emergency Department on 04/04/2021 for the symptoms described in the history of present illness. He was evaluated in the context of the global COVID-19 pandemic, which necessitated consideration that the patient might be at risk for infection with the SARS-CoV-2 virus that causes COVID-19. Institutional protocols and algorithms that pertain to the evaluation of patients at risk for COVID-19 are in a state of rapid change based on information released by regulatory bodies including the CDC and federal and state organizations. These policies and  algorithms were followed during the patient's care in the ED.  I reviewed the patient's prescription history over the last 12 months in the multi-state controlled substances database(s) that includes [place], [place], [place], [place], [place], [place], [place], [place], [place], [place], [place], [place], [place], and [place].  Results were notable for no current RX noted.  ____________________________________________  FINAL CLINICAL IMPRESSION(S) / ED DIAGNOSES  Final diagnoses:  Primary osteoarthritis of left knee  Knee effusion, left      Tanelle Lanzo, Charlesetta Ivory, PA-C 04/04/21 1209    Sharman Cheek, MD 04/04/21 1534

## 2021-04-04 NOTE — ED Triage Notes (Signed)
Pt to ED for swelling to left knee. States he is supposed to have surgery later in the month for knee replacement. Denies injury

## 2021-04-04 NOTE — Discharge Instructions (Addendum)
Take the prescription pain medicine as needed, and the anti-inflammatory as prescribed.  You will have to discontinue your anti-inflammatory 3 to 5 days before your scheduled surgery.  Follow-up with your primary provider or orthopedic specialist for ongoing symptoms.  Rest with the leg elevated and apply ice to reduce swelling.  Return to the ED if needed.

## 2021-06-04 ENCOUNTER — Ambulatory Visit (HOSPITAL_COMMUNITY): Payer: Medicare Other | Attending: Orthopaedic Surgery

## 2021-06-04 ENCOUNTER — Other Ambulatory Visit: Payer: Self-pay

## 2021-06-04 VITALS — BP 115/71 | HR 70 | Temp 98.6°F | Resp 17

## 2021-06-04 DIAGNOSIS — M6281 Muscle weakness (generalized): Secondary | ICD-10-CM

## 2021-06-04 DIAGNOSIS — R2681 Unsteadiness on feet: Secondary | ICD-10-CM | POA: Diagnosis present

## 2021-06-04 NOTE — Therapy (Signed)
[facility] [address] [place], [place], [address] Phone: [phone]   Fax:  [phone]  Physical Therapy Treatment  Patient Details  Name: Randy Marsh MRN: [id number] Date of Birth: 1956/01/19 Referring Provider (PT): Marianna Fuss, MD   Encounter Date: 06/04/2021   PT End of Session - 06/04/21 0942     Visit Number 1    Number of Visits 4    Date for PT Re-Evaluation 07/02/21    Authorization Type UHC Medicare, no auth/VL    PT Start Time 0945    PT Stop Time 1030    PT Time Calculation (min) 45 min    Activity Tolerance Patient tolerated treatment well    Behavior During Therapy [facility] for tasks assessed/performed             Past Medical History:  Diagnosis Date   Diabetes mellitus without complication (HCC)    Hypertension     Past Surgical History:  Procedure Laterality Date   OTHER SURGICAL HISTORY     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   Subjective Assessment - 06/04/21 0944     Subjective Left TKR on 04/24/21 and had been home health and had been going to outpatient therapy in [place] since. Pt was D/C from that outpatient clinic yesterday and rpesents today for continued POC.    Currently in Pain? No/denies                [facility] PT Assessment - 06/04/21 0001       Assessment   Medical Diagnosis left TKR    Referring Provider (PT) Marianna Fuss, MD    Onset Date/Surgical Date 04/24/21    Next MD Visit 06/2021      Balance Screen   Has the patient fallen in the past 6 months No    Has the patient had a decrease in activity level because of a fear of falling?  No    Is the patient reluctant to leave their home because of a fear of falling?  No      Home Environment   Living Environment Private residence    Living Arrangements Alone    Type of Home Mobile home    Home Access Level entry    Home Layout One level    Home  Equipment [place] - single point      Prior Function   Level of Independence Independent    Vocation Retired    Leisure Museum/gallery exhibitions officer tests Single leg stance      Single Leg Stance   Comments LLE: 12 sec      ROM / Strength   AROM / PROM / Strength AROM;Strength      AROM   AROM Assessment Site Knee    Right/Left Knee Left    Left Knee Extension 3   lacking, springy end-feel   Left Knee Flexion 118      Strength   Overall Strength Deficits    Overall Strength Comments 3-5 degree extensor lag    Strength Assessment Site Knee    Right/Left Knee Left    Left Knee Flexion 4/5    Left Knee Extension 4-/5      Transfers   Five time sit to stand comments  9 sec      Ambulation/Gait   Ambulation/Gait Yes  Ambulation/Gait Assistance 7: Independent    Ambulation Distance (Feet) 350 Feet    Assistive device None    Gait Pattern Lateral trunk lean to left    Ambulation Surface Level;Indoor    Gait Comments      Standardized Balance Assessment   Standardized Balance Assessment Dynamic Gait Index      Dynamic Gait Index   Level Surface Mild Impairment    Change in Gait Speed Mild Impairment    Gait with Horizontal Head Turns Mild Impairment    Gait with Vertical Head Turns Mild Impairment    Gait and Pivot Turn Moderate Impairment    Step Over Obstacle Moderate Impairment    Step Around Obstacles Mild Impairment    Steps Moderate Impairment    Total Score 13                                    PT Education - 06/04/21 1026     Education Details discussion regarding CLOF and benefit of progressing program for more robust strengthening and dynamic balance to improve ability to ambulate uneven surfaces, stairs, etc    Person(s) Educated Patient    Methods Explanation    Comprehension Verbalized understanding              PT Short Term Goals - 06/04/21 1031       PT SHORT TERM GOAL #1   Title Patient will  be independent with HEP in order to improve functional outcomes.    Time 2    Period Weeks    Status New    Target Date 06/18/21      PT SHORT TERM GOAL #2   Title Demo improved balance/gait abilities as evidenced by score 19/24 to reduce risk for falls    Baseline 13/24    Time 2    Period Weeks    Status New    Target Date 06/18/21               PT Long Term Goals - 06/04/21 1032       PT LONG TERM GOAL #1   Title Demo improved LLE strength as evident by no extensor lag with SLR    Baseline 3-5 degree lag    Time 4    Period Weeks    Status New    Target Date 07/02/21      PT LONG TERM GOAL #2   Title ambulate independently with normalized gait pattern and reciprocal stair ambulation    Time 4    Period Weeks    Status New    Target Date 07/02/21                   Plan - 06/04/21 1027     Clinical Impression Statement Pt is 65 yo man with hx of left TKR and is overall doing very well but continues to demonstrate left quad weakness and gait deviations with difficulty in manuevering/negotiating obstacles and would benefit from continued PT services to improve LE strength and improve dynamic balance to reduce risk for falls during his usual activities which include trekking/hunting in the woods    Personal Factors and Comorbidities Time since onset of injury/illness/exacerbation    Examination-Activity Limitations Stairs;Squat;Locomotion Level    Examination-Participation Restrictions Yard Work;Other   unable to participate in his usual outdoors activities due to balance/gait deviations   Stability/Clinical Decision Making Stable/Uncomplicated  Clinical Decision Making Low    Rehab Potential Excellent    PT Frequency 1x / week    PT Duration 4 weeks    PT Treatment/Interventions ADLs/Self Care Home Management;Electrical Stimulation;Gait training;Stair training;Therapeutic activities;Therapeutic exercise;Balance training;Patient/family  education;Neuromuscular re-education;Manual techniques;Taping    PT Next Visit Plan standing/WBing strengthening exercises, dynamic standing balance activities    PT Home Exercise Plan continue with previous PT POC LE ther ex    Consulted and Agree with Plan of Care Patient             Patient will benefit from skilled therapeutic intervention in order to improve the following deficits and impairments:  Abnormal gait, Decreased balance, Decreased strength, Difficulty walking  Visit Diagnosis: Muscle weakness (generalized)  Unsteadiness on feet     Problem List There are no problems to display for this patient.   Dion Body, PT 06/04/2021, 10:34 AM  [facility] [address] [place], [place], [address] Phone: [phone]   Fax:  [phone]  Name: Randy Marsh MRN: [id number] Date of Birth: 05-Aug-1955

## 2021-06-11 ENCOUNTER — Ambulatory Visit (HOSPITAL_COMMUNITY): Payer: Medicare Other | Admitting: Physical Therapy

## 2021-06-12 ENCOUNTER — Ambulatory Visit (HOSPITAL_COMMUNITY): Payer: Medicare Other | Admitting: Physical Therapy

## 2021-06-12 ENCOUNTER — Encounter (HOSPITAL_COMMUNITY): Payer: Self-pay | Admitting: Physical Therapy

## 2021-06-12 ENCOUNTER — Other Ambulatory Visit: Payer: Self-pay

## 2021-06-12 DIAGNOSIS — R2681 Unsteadiness on feet: Secondary | ICD-10-CM

## 2021-06-12 DIAGNOSIS — M6281 Muscle weakness (generalized): Secondary | ICD-10-CM

## 2021-06-12 NOTE — Patient Instructions (Signed)
Access Code: [email or web address] URL: [email or web address] Date: 06/12/2021 Prepared by: Greig Castilla Jevin Camino  Exercises Lateral Step Down - 1 x daily - 7 x weekly - 3 sets - 10 reps Hip Abduction with Resistance Loop - 1 x daily - 7 x weekly - 3 sets - 10 reps

## 2021-06-12 NOTE — Therapy (Signed)
[facility] [address] [place], [place], [address] Phone: [phone]   Fax:  [phone]  Physical Therapy Treatment  Patient Details  Name: Randy Marsh MRN: [id number] Date of Birth: 03/13/1956 Referring Provider (PT): Marianna Fuss, MD   Encounter Date: 06/12/2021   PT End of Session - 06/12/21 1312     Visit Number 2    Number of Visits 4    Date for PT Re-Evaluation 07/02/21    Authorization Type UHC Medicare, no auth/VL    PT Start Time 1313    PT Stop Time 1352    PT Time Calculation (min) 39 min    Activity Tolerance Patient tolerated treatment well    Behavior During Therapy [place] [facility] for tasks assessed/performed             Past Medical History:  Diagnosis Date   Diabetes mellitus without complication (HCC)    Hypertension     Past Surgical History:  Procedure Laterality Date   OTHER SURGICAL HISTORY     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   Subjective Assessment - 06/12/21 1313     Subjective Patient states some pain in the knee today. he tries to get up and walk a little every day.    Currently in Pain? No/denies                               [facility] Adult PT Treatment/Exercise - 06/12/21 0001       Exercises   Exercises Knee/Hip      Knee/Hip Exercises: Standing   Heel Raises 20 reps    Terminal Knee Extension Left;10 reps    Terminal Knee Extension Limitations 5 plates 4-16  second holds    Hip Abduction Both;2 sets;15 reps    Abduction Limitations green band at knees    Lateral Step Up Left;2 sets;10 reps;Hand Hold: 1;Step Height: 6"    Lateral Step Up Limitations eccentric control    Forward Step Up Left;2 sets;10 reps;Hand Hold: 1;Step Height: 4";Step Height: 6"    Forward Step Up Limitations 2 sets 4 inch, 2 sets 6 inch      Knee/Hip Exercises: Seated   Other Seated Knee/Hip Exercises HS iso with green ball 10 x 5  second holds      Knee/Hip Exercises: Supine   Straight Leg Raises Left;10 reps;3 sets    Knee Extension AROM    Knee Extension Limitations lacking3    Knee Flexion AROM    Knee Flexion Limitations 117                     PT Education - 06/12/21 1313     Education Details HEP    Person(s) Educated Patient    Methods Explanation;Demonstration    Comprehension Verbalized understanding;Returned demonstration              PT Short Term Goals - 06/04/21 1031       PT SHORT TERM GOAL #1   Title Patient will be independent with HEP in order to improve functional outcomes.    Time 2    Period Weeks    Status New    Target Date 06/18/21      PT SHORT TERM GOAL #2   Title Demo improved balance/gait abilities as evidenced by score 19/24 to reduce risk for falls  Baseline 13/24    Time 2    Period Weeks    Status New    Target Date 06/18/21               PT Long Term Goals - 06/04/21 1032       PT LONG TERM GOAL #1   Title Demo improved LLE strength as evident by no extensor lag with SLR    Baseline 3-5 degree lag    Time 4    Period Weeks    Status New    Target Date 07/02/21      PT LONG TERM GOAL #2   Title ambulate independently with normalized gait pattern and reciprocal stair ambulation    Time 4    Period Weeks    Status New    Target Date 07/02/21                   Plan - 06/12/21 1313     Clinical Impression Statement Patient ambulating into clinic with minimal knee flexion extension ROM with antalgic gait and SPC. Began session on bike for dynamic warm up and ROM. He demonstrates good ROM but lacking TKE. He requires intermittent cueing for proper step mechanics with fair carry over. He demonstrates good/fair eccentric strength and motor control. Notes moderate fatigue at end of session. Patient will continue to benefit from skilled physical therapy in order to reduce impairment and improve function.    Personal Factors and  Comorbidities Time since onset of injury/illness/exacerbation    Examination-Activity Limitations Stairs;Squat;Locomotion Level    Examination-Participation Restrictions Yard Work;Other   unable to participate in his usual outdoors activities due to balance/gait deviations   Stability/Clinical Decision Making Stable/Uncomplicated    Rehab Potential Excellent    PT Frequency 1x / week    PT Duration 4 weeks    PT Treatment/Interventions ADLs/Self Care Home Management;Electrical Stimulation;Gait training;Stair training;Therapeutic activities;Therapeutic exercise;Balance training;Patient/family education;Neuromuscular re-education;Manual techniques;Taping    PT Next Visit Plan standing/WBing strengthening exercises, dynamic standing balance activities    PT Home Exercise Plan continue with previous PT POC LE ther ex, 11/16 lateral step down, hip abd with band    Consulted and Agree with Plan of Care Patient             Patient will benefit from skilled therapeutic intervention in order to improve the following deficits and impairments:  Abnormal gait, Decreased balance, Decreased strength, Difficulty walking  Visit Diagnosis: Muscle weakness (generalized)  Unsteadiness on feet     Problem List There are no problems to display for this patient.   1:53 PM, 06/12/21 Wyman Songster PT, DPT Physical Therapist at [facility]   [facility] [address] [place], [place], [address] Phone: [phone]   Fax:  [phone]  Name: JOEY LIERMAN MRN: [id number] Date of Birth: 1956/04/11

## 2021-06-17 ENCOUNTER — Telehealth (HOSPITAL_COMMUNITY): Payer: Self-pay | Admitting: Physical Therapy

## 2021-06-17 ENCOUNTER — Ambulatory Visit (HOSPITAL_COMMUNITY): Payer: Medicare Other | Admitting: Physical Therapy

## 2021-06-17 NOTE — Telephone Encounter (Signed)
NS. VM not set up and unable to leave VM about missed apt and about upcoming apts.  9:36 AM, 06/17/21 Tereasa Coop, DPT Physical Therapy with [facility]  [phone] office

## 2021-06-25 ENCOUNTER — Other Ambulatory Visit: Payer: Self-pay

## 2021-06-25 ENCOUNTER — Ambulatory Visit (HOSPITAL_COMMUNITY): Payer: Medicare Other

## 2021-06-25 DIAGNOSIS — R2681 Unsteadiness on feet: Secondary | ICD-10-CM

## 2021-06-25 DIAGNOSIS — M6281 Muscle weakness (generalized): Secondary | ICD-10-CM

## 2021-06-25 NOTE — Therapy (Signed)
[facility] [address] [place], [place], [address] Phone: [phone]   Fax:  [phone]  Patient Details  Name: Randy Marsh MRN: [id number] Date of Birth: 01-16-1956 Referring Provider:  Ethelda Chick, MD  Encounter Date: 06/25/2021  Patient arrived for session and initiated, but ended session due to leaving for family emergency. Pt will reschedule at later time.  10:46 AM, 06/25/21 M. Shary Decamp, PT, DPT Physical Therapist- [place] Office Number: [phone]   [facility] [address] [place], [place], [address] Phone: [phone]   Fax:  [phone]

## 2021-06-26 ENCOUNTER — Ambulatory Visit (HOSPITAL_COMMUNITY): Payer: Medicare Other

## 2021-06-26 DIAGNOSIS — R2681 Unsteadiness on feet: Secondary | ICD-10-CM

## 2021-06-26 DIAGNOSIS — M6281 Muscle weakness (generalized): Secondary | ICD-10-CM

## 2021-06-26 NOTE — Therapy (Signed)
[place] [facility] [address] [place], [place], [address] Phone: [phone]   Fax:  [phone]  Physical Therapy Treatment  Patient Details  Name: Randy Marsh MRN: [id number] Date of Birth: [birth year] Referring Provider (PT): Marianna Fuss, MD   Encounter Date: 06/26/2021   PT End of Session - 06/26/21 1436     Visit Number 3    Number of Visits 4    Date for PT Re-Evaluation 07/02/21    Authorization Type UHC Medicare, no auth/VL    PT Start Time 1430    PT Stop Time 1515    PT Time Calculation (min) 45 min    Activity Tolerance Patient tolerated treatment well    Behavior During Therapy [facility] for tasks assessed/performed             Past Medical History:  Diagnosis Date   Diabetes mellitus without complication (HCC)    Hypertension     Past Surgical History:  Procedure Laterality Date   OTHER SURGICAL HISTORY     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   Subjective Assessment - 06/26/21 1436     Subjective "Feeling pretty good other than getting stiff in the knee after sitting for a while". Notes he can walk up the steps with step over pattern    Currently in Pain? No/denies                [facility] PT Assessment - 06/26/21 0001       Assessment   Medical Diagnosis left TKR    Referring Provider (PT) Marianna Fuss, MD    Onset Date/Surgical Date 04/24/21    Next MD Visit 06/2021                           [facility] Adult PT Treatment/Exercise - 06/26/21 0001       Knee/Hip Exercises: Aerobic   Recumbent Bike Bike x 4 min for dynamic warm-up      Knee/Hip Exercises: Standing   Terminal Knee Extension Strengthening;Left;3 sets;10 reps    Terminal Knee Extension Limitations 4 plates    Lateral Step Up Left;2 sets;10 reps;Hand Hold: 1;Step Height: 6"    Lateral Step Up Limitations eccentric control with tactile cues,    Forward Step Up  2 sets;10 reps;Step Height: 6";Hand Hold: 1    Walking with Sports Cord retro-walking 4 plates 2x2 min      Knee/Hip Exercises: Supine   Knee Extension AROM    Knee Extension Limitations lacking 3-5    Knee Flexion AROM    Knee Flexion Limitations 117      Manual Therapy   Manual Therapy Joint mobilization    Manual therapy comments completed separate from other interventions    Joint Mobilization posterior glide left tibia to improve terminal knee extension.  Improved ROM after manual. Approx 2-fingers width popliteal pre-tx, full extension post-tx                       PT Short Term Goals - 06/04/21 1031       PT SHORT TERM GOAL #1   Title Patient will be independent with HEP in order to improve functional outcomes.    Time 2    Period Weeks    Status New    Target Date 06/18/21      PT SHORT TERM  GOAL #2   Title Demo improved balance/gait abilities as evidenced by score 19/24 to reduce risk for falls    Baseline 13/24    Time 2    Period Weeks    Status New    Target Date 06/18/21               PT Long Term Goals - 06/04/21 1032       PT LONG TERM GOAL #1   Title Demo improved LLE strength as evident by no extensor lag with SLR    Baseline 3-5 degree lag    Time 4    Period Weeks    Status New    Target Date 07/02/21      PT LONG TERM GOAL #2   Title ambulate independently with normalized gait pattern and reciprocal stair ambulation    Time 4    Period Weeks    Status New    Target Date 07/02/21                   Plan - 06/26/21 1509     Clinical Impression Statement Tolerating tx sessions well. Some difficulty with eccentric left knee control which improved with verbal and tactile cues for proper loading response (pushing tibia into therapist hand for assisted lowering/eccentric).  Still lacking left terminal knee extension with approx 2-finger's width from popliteal to bed surface. Improved to full extension after manual  therapy for overpressure and achieving terminal knee extension with springy end-feel appreciated.  Continued session for assessment and to re-emphasize importance of STRENGTH HEP to improve left knee function    Personal Factors and Comorbidities Time since onset of injury/illness/exacerbation    Examination-Activity Limitations Stairs;Squat;Locomotion Level    Examination-Participation Restrictions Yard Work;Other   unable to participate in his usual outdoors activities due to balance/gait deviations   Stability/Clinical Decision Making Stable/Uncomplicated    Rehab Potential Excellent    PT Frequency 1x / week    PT Duration 4 weeks    PT Treatment/Interventions ADLs/Self Care Home Management;Electrical Stimulation;Gait training;Stair training;Therapeutic activities;Therapeutic exercise;Balance training;Patient/family education;Neuromuscular re-education;Manual techniques;Taping    PT Next Visit Plan standing/WBing strengthening exercises, dynamic standing balance activities    PT Home Exercise Plan continue with previous PT POC LE ther ex, 11/16 lateral step down, hip abd with band    Consulted and Agree with Plan of Care Patient             Patient will benefit from skilled therapeutic intervention in order to improve the following deficits and impairments:  Abnormal gait, Decreased balance, Decreased strength, Difficulty walking  Visit Diagnosis: Muscle weakness (generalized)  Unsteadiness on feet     Problem List There are no problems to display for this patient.   Dion Body, PT 06/26/2021, 3:13 PM  [place] [facility] [address] [place], [place], [address] Phone: [phone]   Fax:  [phone]  Name: Randy Marsh MRN: [id number] Date of Birth: [birth year]

## 2021-07-02 ENCOUNTER — Ambulatory Visit (HOSPITAL_COMMUNITY): Payer: Medicare Other | Attending: Orthopaedic Surgery

## 2021-07-02 ENCOUNTER — Other Ambulatory Visit: Payer: Self-pay

## 2021-07-02 DIAGNOSIS — R2681 Unsteadiness on feet: Secondary | ICD-10-CM | POA: Insufficient documentation

## 2021-07-02 DIAGNOSIS — M6281 Muscle weakness (generalized): Secondary | ICD-10-CM | POA: Insufficient documentation

## 2021-07-02 NOTE — Therapy (Signed)
[place] [address] [place], [place], [address] Phone: [phone]   Fax:  [phone]  Physical Therapy Treatment and D/C Summary  Patient Details  Name: Randy Marsh MRN: [id number] Date of Birth: 17-Apr-1956 Referring Provider (PT): Vonna Kotyk, MD  PHYSICAL THERAPY DISCHARGE SUMMARY  Visits from Start of Care: 4  Current functional level related to goals / functional outcomes: See below   Remaining deficits: No issues   Education / Equipment: HEP   Patient agrees to discharge. Patient goals were met. Patient is being discharged due to meeting the stated rehab goals.  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   Subjective Assessment - 07/02/21 0827     Subjective Notes overall improvement in his balance and gait and walks on a frequent basis. Reports not performing much specific exercise/stretching    Currently in Pain? No/denies                [facility] PT Assessment - 07/02/21 0001       Assessment   Medical Diagnosis left TKR    Referring Provider (PT) Vonna Kotyk, MD    Onset Date/Surgical Date 04/24/21    Next MD Visit 06/2021      Ambulation/Gait   Ambulation Distance (Feet) 400 Feet    Assistive device None    Gait Pattern Within Functional Limits     Ambulation Surface Level;Indoor    Stairs Yes    Stairs Assistance 7: Independent      Dynamic Gait Index   Level Surface Normal    Change in Gait Speed Normal    Gait with Horizontal Head Turns Normal    Gait with Vertical Head Turns Normal    Gait and Pivot Turn Normal    Step Over Obstacle Normal    Step Around Obstacles Normal    Steps Normal    Total Score 24                           [facility] Adult PT Treatment/Exercise - 07/02/21 0001       Ambulation/Gait   Ambulation/Gait Yes    Ambulation/Gait Assistance 7: Independent    Gait Comments 2MWT. Gait on uneven surfaces and obstacles, compliant surfaces, stepping over hurdles for foot clearance. some circumduction      Knee/Hip Exercises: Standing   Hip Flexion Stengthening;Both;2 sets;10 reps    SLS 2x30 sec      Knee/Hip Exercises: Seated   Sit to Sand 20 reps;without UE support      Knee/Hip Exercises: Supine   Knee Extension AROM  Knee Extension Limitations lacking 3-5    Knee Flexion AROM    Knee Flexion Limitations 117                       PT Short Term Goals - 07/02/21 8182       PT SHORT TERM GOAL #1   Title Patient will be independent with HEP in order to improve functional outcomes.    Time 2    Period Weeks    Status Achieved    Target Date 06/18/21      PT SHORT TERM GOAL #2   Title Demo improved balance/gait abilities as evidenced by score 19/24 to reduce risk for falls    Baseline 13/24 at start, 24/24 presently    Time 2    Period Weeks    Status Achieved    Target Date 06/18/21               PT Long Term Goals - 07/02/21 0846       PT LONG TERM GOAL #1   Title Demo improved LLE strength as evident by no extensor lag with SLR    Baseline No lag 5/5 strength    Time 4    Period Weeks    Status Achieved      PT LONG TERM GOAL #2   Title ambulate independently with normalized gait pattern and reciprocal stair ambulation    Baseline Independent  level surfaces and reciprocal stair    Time 4    Period Weeks    Status Achieved                   Plan - 07/02/21 0853     Clinical Impression Statement Tolerated tx sessions well and demonstrates global improvements in ambulation and balance with improved DGI from 13/24 to 24/24 and exhibits minimal gait dysfunction.  ROM and strength improvements evident by performance and able to negotiate uneven ground independently and improve dynamic and static balance. Pt will D/C to HEP at this time due to goals met and pleased with functional status    Personal Factors and Comorbidities Time since onset of injury/illness/exacerbation    Examination-Activity Limitations Stairs;Squat;Locomotion Level    Examination-Participation Restrictions Yard Work;Other   unable to participate in his usual outdoors activities due to balance/gait deviations   Stability/Clinical Decision Making Stable/Uncomplicated    Rehab Potential Excellent    PT Frequency 1x / week    PT Duration 4 weeks    PT Treatment/Interventions ADLs/Self Care Home Management;Electrical Stimulation;Gait training;Stair training;Therapeutic activities;Therapeutic exercise;Balance training;Patient/family education;Neuromuscular re-education;Manual techniques;Taping    PT Next Visit Plan standing/WBing strengthening exercises, dynamic standing balance activities    PT Home Exercise Plan continue with previous PT POC LE ther ex, 11/16 lateral step down, hip abd with band    Consulted and Agree with Plan of Care Patient             Patient will benefit from skilled therapeutic intervention in order to improve the following deficits and impairments:  Abnormal gait, Decreased balance, Decreased strength, Difficulty walking  Visit Diagnosis: Muscle weakness (generalized)  Unsteadiness on feet     Problem List There are no problems to display for this patient.   Toniann Fail, PT 07/02/2021, 8:56 AM  [place], [place], [address] Phone: [phone]   Fax:  [phone]  Name: Randy Marsh MRN: [id number] Date of Birth: [birth year]

## 2021-08-19 ENCOUNTER — Ambulatory Visit (HOSPITAL_COMMUNITY): Payer: 59 | Admitting: Physical Therapy

## 2021-08-21 ENCOUNTER — Ambulatory Visit (HOSPITAL_COMMUNITY): Payer: 59 | Attending: Orthopaedic Surgery | Admitting: Physical Therapy

## 2021-08-21 DIAGNOSIS — M25562 Pain in left knee: Secondary | ICD-10-CM | POA: Insufficient documentation

## 2021-08-23 ENCOUNTER — Encounter (HOSPITAL_COMMUNITY): Payer: Self-pay | Admitting: Physical Therapy

## 2021-08-23 ENCOUNTER — Ambulatory Visit (HOSPITAL_COMMUNITY): Payer: 59 | Admitting: Physical Therapy

## 2021-08-23 ENCOUNTER — Other Ambulatory Visit: Payer: Self-pay

## 2021-08-23 DIAGNOSIS — M25562 Pain in left knee: Secondary | ICD-10-CM

## 2021-08-23 NOTE — Therapy (Signed)
[place], [place], [address] Phone: [phone]   Fax:  [phone]  Physical Therapy Evaluation  Patient Details  Name: Randy Marsh MRN: [id number] Date of Birth: [birth year] Referring Provider (PT): Vonna Kotyk, MD   Encounter Date: 08/23/2021   PT End of Session - 08/23/21 0927     Visit Number 1    Number of Visits 1    Date for PT Re-Evaluation 08/23/21    Authorization Type UHC dual complete    PT Start Time 0901    PT Stop Time 0940    PT Time Calculation (min) 39 min    Activity Tolerance Patient tolerated treatment well    Behavior During Therapy [facility] for tasks assessed/performed             Past Medical History:  Diagnosis Date   Diabetes mellitus without complication ([place])    Hypertension     Past Surgical History:  Procedure Laterality Date   OTHER SURGICAL HISTORY     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    Subjective Assessment - 08/23/21 0905     Subjective Patient presents to therapy with complaint of LT knee pain. HE has LT TKA in September and had prior therapy. He was DC with all goals met. He states he had continued trouble with swelling after DC from therapy. He says he went back to MD and had fluid removed. He says he is doing much better since then. He feels he has good movement in it and is walking better now. He does note mild swelling still and occasional pain.    Pertinent History LT TKA    Limitations Standing;Walking    Patient Stated Goals get the swelling and pain down    Currently in Pain? Yes    Pain Score 5     Pain Location Knee    Pain Orientation Left    Pain Descriptors / Indicators Aching    Pain Type Acute pain    Pain Onset More than a month ago    Pain Frequency Intermittent    Aggravating Factors  prolonged standing, sitting    Effect of Pain on Daily Activities Limits                 [facility] PT Assessment - 08/23/21 0001       Assessment   Medical Diagnosis LT knee pain    Referring Provider (PT) Vonna Kotyk, MD    Onset Date/Surgical Date 04/24/21    Prior Therapy Yes      Precautions   Precautions None      Restrictions   Weight Bearing Restrictions No      Balance Screen   Has the patient fallen in the past 6 months Yes    How many times? 1    Has the patient had a decrease in activity level because of a fear of falling?  No    Is the patient reluctant to leave their home because of a fear of falling?  No      Home Ec[employer] residence      Prior Function   Level of Independence Independent      Cognition   Overall Cognitive Status Within Functional Limits for tasks assessed      AROM   Left Knee Extension -4    Left Knee Flexion 115  Strength   Strength Assessment Site Hip;Knee    Right/Left Hip Right;Left    Right Hip Flexion 5/5    Right Hip Extension 4+/5    Right Hip ABduction 4+/5    Left Hip Flexion 5/5    Left Hip Extension 4+/5    Left Hip ABduction 4+/5    Right/Left Knee Right;Left    Right Knee Extension 5/5    Left Knee Extension 4+/5      Ambulation/Gait   Ambulation/Gait Yes    Ambulation/Gait Assistance 7: Independent    Assistive device None    Gait Pattern Step-through pattern   mild trendelenberg to RT, quite possible this way at baseline   Ambulation Surface Level;Indoor    Stairs Yes    Stairs Assistance 7: Independent    Stair Management Technique No rails;Alternating pattern    Number of Stairs 8    Height of Stairs 7      Balance   Balance Assessed Yes      Static Standing Balance   Static Standing Balance -  Activities  Single Leg Stance - Right Leg;Single Leg Stance - Left Leg    Static Standing - Comment/# of Minutes >15 second bilateral                        Objective measurements completed on examination: See above findings.                 PT Education - 08/23/21 0927     Education Details on Evaluation findings, activity modification and modalities to reduce swelling in knee    Person(s) Educated Patient    Methods Explanation    Comprehension Verbalized understanding              PT Short Term Goals - 08/23/21 0934       PT SHORT TERM GOAL #1   Title Review HEP and activity modification, modalities to reduce knee pain and swelling for improved quality of life and ability to perform ADLs.    Time 1    Period Weeks    Status Achieved    Target Date 08/23/21                       Plan - 08/23/21 1610     Clinical Impression Statement Patient is a 66 yo male who presents to therapy with complaint of LT knee pain and mild swelling after LT TKA 04/18/21. Objective testing reveals little to no functional deficits, swelling in knee is mild, but not unusual given patient reported activity level. Appears directly corelated to level of physical activity. Educated patient on use of compression garments, icing as modality and elevating limb in the evening for reduced swelling and pain. Balance, strength, ROM and gait are all WFL. Low level swelling is his chief complaint. Today is 1 x visit for eval and patient education only. Encouraged patient to follow up with therapy services with any further questions or concerns.    Examination-Activity Limitations Locomotion Level;Stand    Examination-Participation Restrictions Tour manager    Stability/Clinical Decision Making Stable/Uncomplicated    Clinical Decision Making Low    Rehab Potential Good    PT Frequency 1x / week    PT Duration --   1 x eval only   PT Treatment/Interventions Therapeutic exercise;Patient/family education    PT Next Visit Plan 1 x eval only    Consulted and Agree with Plan of Care  Patient             Patient will benefit from skilled therapeutic intervention in order to improve the following deficits and  impairments:  Increased edema, Pain  Visit Diagnosis: Left knee pain, unspecified chronicity     Problem List There are no problems to display for this patient.  9:37 AM, 08/23/21 Josue Hector PT DPT  Physical Therapist with [facility]  [phone] [place] [address] [place], [place], [address] Phone: [phone]   Fax:  [phone]  Name: Randy Marsh MRN: [id number] Date of Birth: [birth year]

## 2021-08-26 ENCOUNTER — Encounter (HOSPITAL_COMMUNITY): Payer: Medicare Other | Admitting: Physical Therapy

## 2021-08-28 ENCOUNTER — Encounter (HOSPITAL_COMMUNITY): Payer: Medicare Other | Admitting: Physical Therapy

## 2021-08-30 ENCOUNTER — Encounter (HOSPITAL_COMMUNITY): Payer: Medicare Other | Admitting: Physical Therapy

## 2021-09-02 ENCOUNTER — Encounter (HOSPITAL_COMMUNITY): Payer: Medicare Other | Admitting: Physical Therapy

## 2021-09-04 ENCOUNTER — Encounter (HOSPITAL_COMMUNITY): Payer: Medicare Other | Admitting: Physical Therapy

## 2021-09-06 ENCOUNTER — Encounter (HOSPITAL_COMMUNITY): Payer: Medicare Other | Admitting: Physical Therapy

## 2021-09-09 ENCOUNTER — Encounter (HOSPITAL_COMMUNITY): Payer: Medicare Other | Admitting: Physical Therapy

## 2021-09-11 ENCOUNTER — Encounter (HOSPITAL_COMMUNITY): Payer: Medicare Other | Admitting: Physical Therapy

## 2021-09-13 ENCOUNTER — Emergency Department (HOSPITAL_COMMUNITY)
Admission: EM | Admit: 2021-09-13 | Discharge: 2021-09-13 | Disposition: A | Discharge status: Home / Self Care | Payer: 59 | Attending: Emergency Medicine | Admitting: Emergency Medicine

## 2021-09-13 ENCOUNTER — Emergency Department (HOSPITAL_COMMUNITY): Payer: 59

## 2021-09-13 ENCOUNTER — Encounter (HOSPITAL_COMMUNITY): Payer: Medicare Other | Admitting: Physical Therapy

## 2021-09-13 ENCOUNTER — Other Ambulatory Visit: Payer: Self-pay

## 2021-09-13 ENCOUNTER — Encounter (HOSPITAL_COMMUNITY): Payer: Self-pay

## 2021-09-13 DIAGNOSIS — N410 Acute prostatitis: Secondary | ICD-10-CM | POA: Diagnosis not present

## 2021-09-13 DIAGNOSIS — R1030 Lower abdominal pain, unspecified: Secondary | ICD-10-CM | POA: Diagnosis present

## 2021-09-13 LAB — HEPATIC FUNCTION PANEL
ALT: 18 U/L (ref 0–44)
AST: 19 U/L (ref 15–41)
Albumin: 4 g/dL (ref 3.5–5.0)
Alkaline Phosphatase: 81 U/L (ref 38–126)
Bilirubin, Direct: 0.2 mg/dL (ref 0.0–0.2)
Indirect Bilirubin: 0.5 mg/dL (ref 0.3–0.9)
Total Bilirubin: 0.7 mg/dL (ref 0.3–1.2)
Total Protein: 7.7 g/dL (ref 6.5–8.1)

## 2021-09-13 LAB — BASIC METABOLIC PANEL
Anion gap: 10 (ref 5–15)
BUN: 15 mg/dL (ref 8–23)
CO2: 21 mmol/L — ABNORMAL LOW (ref 22–32)
Calcium: 9.2 mg/dL (ref 8.9–10.3)
Chloride: 104 mmol/L (ref 98–111)
Creatinine, Ser: 0.83 mg/dL (ref 0.61–1.24)
GFR, Estimated: >60 mL/min (ref >60)
Glucose, Bld: 107 mg/dL — ABNORMAL HIGH (ref 70–99)
Potassium: 4.1 mmol/L (ref 3.5–5.1)
Sodium: 135 mmol/L (ref 135–145)

## 2021-09-13 LAB — CBC WITH DIFFERENTIAL/PLATELET
Abs Immature Granulocytes: 0.02 10*3/uL (ref 0.00–0.07)
Basophils Absolute: 0 10*3/uL (ref 0.0–0.1)
Basophils Relative: 1 %
Eosinophils Absolute: 0.2 10*3/uL (ref 0.0–0.5)
Eosinophils Relative: 3 %
HCT: 40.5 % (ref 39.0–52.0)
Hemoglobin: 12.8 g/dL — ABNORMAL LOW (ref 13.0–17.0)
Immature Granulocytes: 0 %
Lymphocytes Relative: 26 %
Lymphs Abs: 2.1 10*3/uL (ref 0.7–4.0)
MCH: 22.2 pg — ABNORMAL LOW (ref 26.0–34.0)
MCHC: 31.6 g/dL (ref 30.0–36.0)
MCV: 70.2 fL — ABNORMAL LOW (ref 80.0–100.0)
Monocytes Absolute: 1 10*3/uL (ref 0.1–1.0)
Monocytes Relative: 12 %
Neutro Abs: 4.9 10*3/uL (ref 1.7–7.7)
Neutrophils Relative %: 58 %
Platelets: 197 10*3/uL (ref 150–400)
RBC: 5.77 MIL/uL (ref 4.22–5.81)
RDW: 19.5 % — ABNORMAL HIGH (ref 11.5–15.5)
WBC: 8.3 10*3/uL (ref 4.0–10.5)
nRBC: 0 % (ref 0.0–0.2)

## 2021-09-13 LAB — URINALYSIS, ROUTINE W REFLEX MICROSCOPIC
Bilirubin Urine: NEGATIVE
Glucose, UA: NEGATIVE mg/dL
Hgb urine dipstick: NEGATIVE
Ketones, ur: NEGATIVE mg/dL
Leukocytes,Ua: NEGATIVE
Nitrite: NEGATIVE
Protein, ur: NEGATIVE mg/dL
Specific Gravity, Urine: 1.011 (ref 1.005–1.030)
pH: 6 (ref 5.0–8.0)

## 2021-09-13 MED ORDER — IOHEXOL 300 MG/ML  SOLN
100.0000 mL | Freq: Once | INTRAMUSCULAR | Status: AC | PRN
  Administered 2021-09-13: 100 mL via INTRAVENOUS

## 2021-09-13 MED ORDER — SULFAMETHOXAZOLE-TRIMETHOPRIM 800-160 MG PO TABS: 1.0000 | ORAL_TABLET | Freq: Two times a day (BID) | ORAL | 0 refills | Status: AC

## 2021-09-13 MED ORDER — SODIUM CHLORIDE 0.9 % IV SOLN
1.0000 g | Freq: Once | INTRAVENOUS | Status: AC
  Administered 2021-09-13: 1 g via INTRAVENOUS
  Filled 2021-09-13: qty 10

## 2021-09-13 NOTE — Discharge Instructions (Addendum)
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  Take the antibiotics as written. Follow-up with the urologist next week.  You will need to call Monday to make an appointment.  Return immediately back to the ER if:  Your symptoms worsen within the next 12-24 hours. You develop new symptoms such as new fevers, persistent vomiting, new pain, shortness of breath, or new weakness or numbness, or if you have any other concerns.

## 2021-09-13 NOTE — ED Triage Notes (Signed)
Pt presents to ED with complaints of lower abdominal pain since Sunday. Pt states he feels an aching pain when he goes from a sitting to standing position. Pt denies N/V/D

## 2021-09-13 NOTE — ED Provider Notes (Signed)
[facility] EMERGENCY DEPARTMENT Provider Note   CSN: [id number] Arrival date & time: 09/13/21  1001     History  Chief Complaint  Patient presents with   Abdominal Pain    Randy Marsh is a 66 y.o. male.  Patient presents to ER chief complaint of suprapubic pain ongoing for about a week.  He states it is an aching pain worse when he changes position or bends his hips.  Describes in the mid suprapubic region.  Does not radiate elsewhere.  No associated fevers no cough no vomiting no diarrhea.  He states he has been having to work harder to urinate over the course of the last 6 to 7 months however he is able to urinate.      Home Medications Prior to Admission medications   Medication Sig Start Date End Date Taking? Authorizing Provider  amLODipine (NORVASC) 10 MG tablet Take 10 mg by mouth daily.   Yes [provider]  atorvastatin (LIPITOR) 40 MG tablet Take 40 mg by mouth daily.   Yes [provider]  Colchicine 0.6 MG CAPS Take 1 tablet by mouth daily. 08/26/21  Yes [provider]  esomeprazole (NEXIUM) 40 MG capsule Take 40 mg by mouth daily. 04/19/21  Yes [provider]  lisinopril (ZESTRIL) 20 MG tablet Take 20 mg by mouth daily.   Yes [provider]  metFORMIN (GLUCOPHAGE-XR) 500 MG 24 hr tablet Take 500 mg by mouth daily. 06/26/21  Yes [provider]  oxyCODONE (OXY IR/ROXICODONE) 5 MG immediate release tablet Take 1 tablet by mouth every 8 (eight) hours. 07/18/21  Yes [provider]  tamsulosin (FLOMAX) 0.4 MG CAPS capsule Take 0.4 mg by mouth daily. 04/19/21  Yes [provider]  HYDROcodone-acetaminophen (NORCO) 5-325 MG tablet Take 1 tablet by mouth 3 (three) times daily as needed. Patient not taking: Reported on 09/13/2021 04/04/21   Menshew, Dannielle Karvonen, PA-C      Allergies    Patient has no known allergies.    Review of Systems   Review of Systems  Constitutional:  Negative for fever.   HENT:  Negative for ear pain and sore throat.   Eyes:  Negative for pain.  Respiratory:  Negative for cough.   Cardiovascular:  Negative for chest pain.  Gastrointestinal:  Positive for abdominal pain.  Genitourinary:  Negative for flank pain.  Musculoskeletal:  Negative for back pain.  Skin:  Negative for color change and rash.  Neurological:  Negative for syncope.  All other systems reviewed and are negative.  Physical Exam Updated Vital Signs BP (!) 148/84 (BP Location: Right Arm)    Pulse 89    Temp 98.6 F (37 C) (Oral)    Resp 16    Ht 5\' 6"  (1.676 m)    Wt 81.6 kg    SpO2 98%    BMI 29.05 kg/m  Physical Exam Constitutional:      Appearance: He is well-developed.  HENT:     Head: Normocephalic.     Nose: Nose normal.  Eyes:     Extraocular Movements: Extraocular movements intact.  Cardiovascular:     Rate and Rhythm: Normal rate.  Pulmonary:     Effort: Pulmonary effort is normal.  Abdominal:     Tenderness: There is abdominal tenderness. There is no right CVA tenderness or left CVA tenderness.     Comments: Suprapubic tenderness present.  No guarding or rebound otherwise.  Skin:    Coloration: Skin is not  jaundiced.  Neurological:     Mental Status: He is alert. Mental status is at baseline.    ED Results / Procedures / Treatments   Labs (all labs ordered are listed, but only abnormal results are displayed) Labs Reviewed  CBC WITH DIFFERENTIAL/PLATELET - Abnormal; Notable for the following components:      Result Value   Hemoglobin 12.8 (*)    MCV 70.2 (*)    MCH 22.2 (*)    RDW 19.5 (*)    All other components within normal limits  BASIC METABOLIC PANEL - Abnormal; Notable for the following components:   CO2 21 (*)    Glucose, Bld 107 (*)    All other components within normal limits  URINALYSIS, ROUTINE W REFLEX MICROSCOPIC  HEPATIC FUNCTION PANEL    EKG None  Radiology CT Abdomen Pelvis W Contrast  Result Date: 09/13/2021 CLINICAL DATA:  Lower  abdominal pain. EXAM: CT ABDOMEN AND PELVIS WITH CONTRAST TECHNIQUE: Multidetector CT imaging of the abdomen and pelvis was performed using the standard protocol following bolus administration of intravenous contrast. RADIATION DOSE REDUCTION: This exam was performed according to the departmental dose-optimization program which includes automated exposure control, adjustment of the mA and/or kV according to patient size and/or use of iterative reconstruction technique. CONTRAST:  177mL OMNIPAQUE IOHEXOL 300 MG/ML  SOLN COMPARISON:  None. FINDINGS: Lower chest: Clear lung bases. Hepatobiliary: No focal liver abnormality is seen. No gallstones, gallbladder wall thickening, or biliary dilatation. Pancreas: Unremarkable. Spleen: Unremarkable. Adrenals/Urinary Tract: Unremarkable adrenal glands. No renal calculi or hydronephrosis. 1.3 cm left renal cyst. Unremarkable bladder. Stomach/Bowel: The stomach is unremarkable. There is no evidence of bowel obstruction. Left-sided colonic diverticulosis is noted without evidence of acute diverticulitis. The appendix is unremarkable. Vascular/Lymphatic: Mild abdominal aortic atherosclerosis without aneurysm. No enlarged lymph nodes. Reproductive: Moderately enlarged prostate gland with edematous appearance and surrounding inflammatory stranding. No abscess identified. Other: No ascites or pneumoperitoneum. Musculoskeletal: Advanced lower lumbar facet arthrosis with trace anterolisthesis of L4 on L5. IMPRESSION: 1. Enlarged/edematous prostate gland with surrounding stranding suggesting acute prostatitis. No abscess. 2. Colonic diverticulosis. 3. Aortic Atherosclerosis (ICD10-I70.0). Electronically Signed   By: Logan Bores M.D.   On: 09/13/2021 12:37    Procedures Procedures    Medications Ordered in ED Medications  cefTRIAXone (ROCEPHIN) 1 g in sodium chloride 0.9 % 100 mL IVPB (has no administration in time range)  iohexol (OMNIPAQUE) 300 MG/ML solution 100 mL (100 mLs  Intravenous Contrast Given 09/13/21 1204)    ED Course/ Medical Decision Making/ A&P                           Medical Decision Making Amount and/or Complexity of Data Reviewed Labs: ordered. Radiology: ordered.  Risk Prescription drug management.   History obtained from daughter at bedside as well.  Review of charts show office visit July 18, 2021 for knee replacement.  Labs unremarkable white count normal chemistry normal urinalysis shows no evidence of infection.  CT abdomen pelvis pursued with findings consistent of prostatitis per radiologist.  Patient given a gram of Rocephin here prescription of Bactrim to go home with.  Recommending outpatient follow-up with urology within the week.  Recommending immediate return for fevers inability urinate or any additional concerns.        Final Clinical Impression(s) / ED Diagnoses Final diagnoses:  Acute prostatitis    Rx / DC Orders ED Discharge Orders     None  Luna Fuse, MD 09/13/21 1248

## 2021-09-13 NOTE — ED Notes (Signed)
Post void Bladder Scan 45ml

## 2021-09-30 ENCOUNTER — Other Ambulatory Visit: Payer: Self-pay

## 2021-09-30 ENCOUNTER — Ambulatory Visit (INDEPENDENT_AMBULATORY_CARE_PROVIDER_SITE_OTHER): Payer: 59 | Admitting: Urology

## 2021-09-30 VITALS — BP 137/77 | HR 88 | Ht 66.0 in | Wt 180.0 lb

## 2021-09-30 DIAGNOSIS — N138 Other obstructive and reflux uropathy: Secondary | ICD-10-CM | POA: Diagnosis not present

## 2021-09-30 DIAGNOSIS — N401 Enlarged prostate with lower urinary tract symptoms: Secondary | ICD-10-CM

## 2021-09-30 DIAGNOSIS — R3912 Poor urinary stream: Secondary | ICD-10-CM | POA: Diagnosis not present

## 2021-09-30 DIAGNOSIS — R972 Elevated prostate specific antigen [PSA]: Secondary | ICD-10-CM | POA: Diagnosis not present

## 2021-09-30 LAB — BLADDER SCAN AMB NON-IMAGING
Scan Result: 0
Scan Result: 161

## 2021-09-30 LAB — URINALYSIS, ROUTINE W REFLEX MICROSCOPIC
Bilirubin, UA: NEGATIVE
Glucose, UA: NEGATIVE
Ketones, UA: NEGATIVE
Leukocytes,UA: NEGATIVE
Nitrite, UA: NEGATIVE
Protein,UA: NEGATIVE
RBC, UA: NEGATIVE
Specific Gravity, UA: 1.025 (ref 1.005–1.030)
Urobilinogen, Ur: 1 mg/dL (ref 0.2–1.0)
pH, UA: 6.5 (ref 5.0–7.5)

## 2021-09-30 NOTE — Progress Notes (Signed)
post void residual=0 ?

## 2021-09-30 NOTE — Progress Notes (Signed)
? ?09/30/2021 ?9:34 AM  ? ?Tracey Harries ?1955-08-01 ?[id number] ? ?Referring provider: Ethelda Chick, MD ?[address] ?[place],  [place] [address] ? ?No chief complaint on file. ? ? ?HPI: ? ?New pt -  ? ?1) BPH - weak stream. On tamsulosin. No surgery. Prostate about 75 g on CT 09/13/2021 (done for SP discomfort). UA clear. Started TMP-SMX. Symptoms improved. Stream adequate. No NG risk.  ? ?PVR = 0. He does not recall a PSA elevation. No prostate biopsy. No FH PCa.  ? 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  ? ?Today, seen for the above.  ? ?PMH: ?Past Medical History:  ?Diagnosis Date  ? Diabetes mellitus without complication (HCC)   ? Hypertension   ? ? ?Surgical History: ?Past Surgical History:  ?Procedure Laterality Date  ? OTHER SURGICAL HISTORY    ? had clogged artery in right leg  ? ? ?Home Medications:  ?Allergies as of 09/30/2021   ?No Known Allergies ?  ? ?  ?Medication List  ?  ? ?  ? Accurate as of September 30, 2021  9:34 AM. If you have any questions, ask your nurse or doctor.  ?  ?  ? ?  ? ?amLODipine 10 MG tablet ?Commonly known as: NORVASC ?Take 10 mg by mouth daily. ?  ?atorvastatin 40 MG tablet ?Commonly known as: LIPITOR ?Take 40 mg by mouth daily. ?  ?Colchicine 0.6 MG Caps ?Take 1 tablet by mouth daily. ?  ?esomeprazole 40 MG capsule ?Commonly known as: NEXIUM ?Take 40 mg by mouth daily. ?  ?HYDROcodone-acetaminophen 5-325 MG tablet ?Commonly known as: Norco ?Take 1 tablet by mouth 3 (three) times daily as needed. ?  ?lisinopril 20 MG tablet ?Commonly known as: ZESTRIL ?Take 20 mg by mouth daily. ?  ?metFORMIN 500 MG 24 hr tablet ?Commonly known as: GLUCOPHAGE-XR ?Take 500 mg by mouth daily. ?  ?oxyCODONE 5 MG immediate release tablet ?Commonly known as: Oxy IR/ROXICODONE ?Take 1 tablet by mouth every 8 (eight) hours. ?  ?sildenafil 100 MG tablet ?Commonly known as: VIAGRA ?Take by mouth. ?  ?sulfamethoxazole-trimethoprim 800-160 MG tablet ?Commonly known as: BACTRIM DS ?Take 1  tablet by mouth 2 (two) times daily for 20 days. ?  ?tamsulosin 0.4 MG Caps capsule ?Commonly known as: FLOMAX ?Take 0.4 mg by mouth daily. ?  ? ?  ? ? ?Allergies: No Known Allergies ? ?Family History: ?Family History  ?Problem Relation Age of Onset  ? Cancer Father   ? ? ?Social History:  reports that he has quit smoking. He has never used smokeless tobacco. He reports current alcohol use. He reports that he does not use drugs. ? ? ?Physical Exam: ?BP 137/77   Pulse 88   Ht 5\' 6"  (1.676 m)   Wt 180 lb (81.6 kg)   BMI 29.05 kg/m?   ?Constitutional:  Alert and oriented, No acute distress. ?HEENT:  AT, moist mucus membranes.  Trachea midline, no masses. ?Cardiovascular: No clubbing, cyanosis, or edema. ?Respiratory: Normal respiratory effort, no increased work of breathing. ?GI: Abdomen is soft, nontender, nondistended, no abdominal masses ?GU: No CVA tenderness ?Lymph: No cervical or inguinal lymphadenopathy. ?Skin: No rashes, bruises or suspicious lesions. ?Neurologic: Grossly intact, no focal deficits, moving all 4 extremities. ?Psychiatric: Normal mood and affect. ? ?Laboratory Data: ?Lab Results  ?Component Value Date  ? WBC 8.3 09/13/2021  ? HGB 12.8 (L) 09/13/2021  ? HCT 40.5 09/13/2021  ? MCV 70.2 (L) 09/13/2021  ? PLT  197 09/13/2021  ? ? ?Lab Results  ?Component Value Date  ? CREATININE 0.83 09/13/2021  ? ? ?No results found for: PSA ? ?No results found for: TESTOSTERONE ? ?No results found for: HGBA1C ? ?Urinalysis ?   ?Component Value Date/Time  ? COLORURINE YELLOW 09/13/2021 1035  ? APPEARANCEUR CLEAR 09/13/2021 1035  ? LABSPEC 1.011 09/13/2021 1035  ? PHURINE 6.0 09/13/2021 1035  ? GLUCOSEU NEGATIVE 09/13/2021 1035  ? HGBUR NEGATIVE 09/13/2021 1035  ? BILIRUBINUR NEGATIVE 09/13/2021 1035  ? KETONESUR NEGATIVE 09/13/2021 1035  ? PROTEINUR NEGATIVE 09/13/2021 1035  ? NITRITE NEGATIVE 09/13/2021 1035  ? LEUKOCYTESUR NEGATIVE 09/13/2021 1035  ? ? ?Lab Results  ?Component Value Date  ? BACTERIA NONE SEEN  12/19/2014  ? ? ?Pertinent Imaging: ?CT a/p - Feb 2023  ? ?Assessment & Plan:   ? ?BPH, weak stream - cont tams. Disc 5ari, procedures, daily tadalafil. He will consider. PSA was sent. Disc nature of PSA elevation and MRI or bx if needed - nature r/b/a to each approach.  ?- Urinalysis, Routine w reflex microscopic ?- BLADDER SCAN AMB NON-IMAGING ? ? ?No follow-ups on file. ? ?Jerilee Field, MD ? ?[facility]  ?[address] ?[place], [place] [address] ?[phone] ? ? ?

## 2021-10-01 LAB — PSA: Prostate Specific Ag, Serum: 3.7 ng/mL (ref 0.0–4.0)

## 2022-01-04 IMAGING — CR DG KNEE COMPLETE 4+V*L*
4 series · 4 of 4 positions shown · non-contrast
Comparison: None.

CLINICAL DATA: Chronic left knee pain without known injury.

EXAM:
LEFT KNEE - COMPLETE 4+ VIEW

[knee ap (1 of 2)]
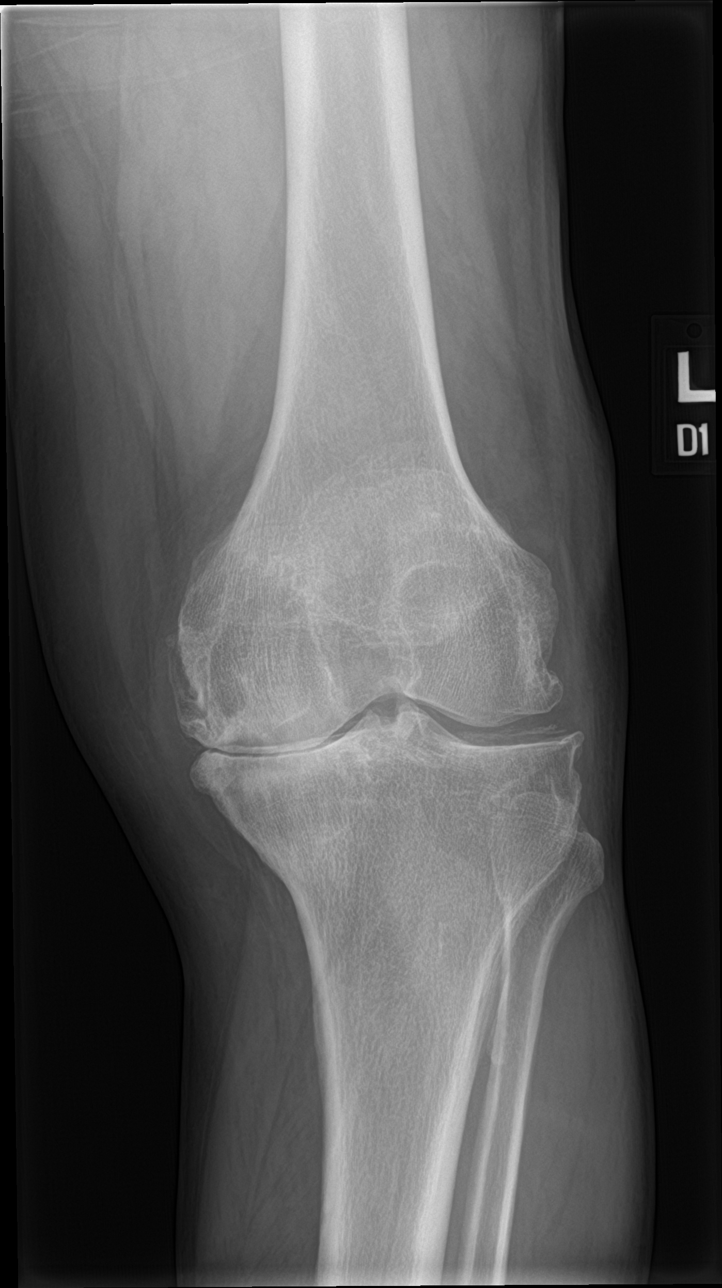

[knee lat]
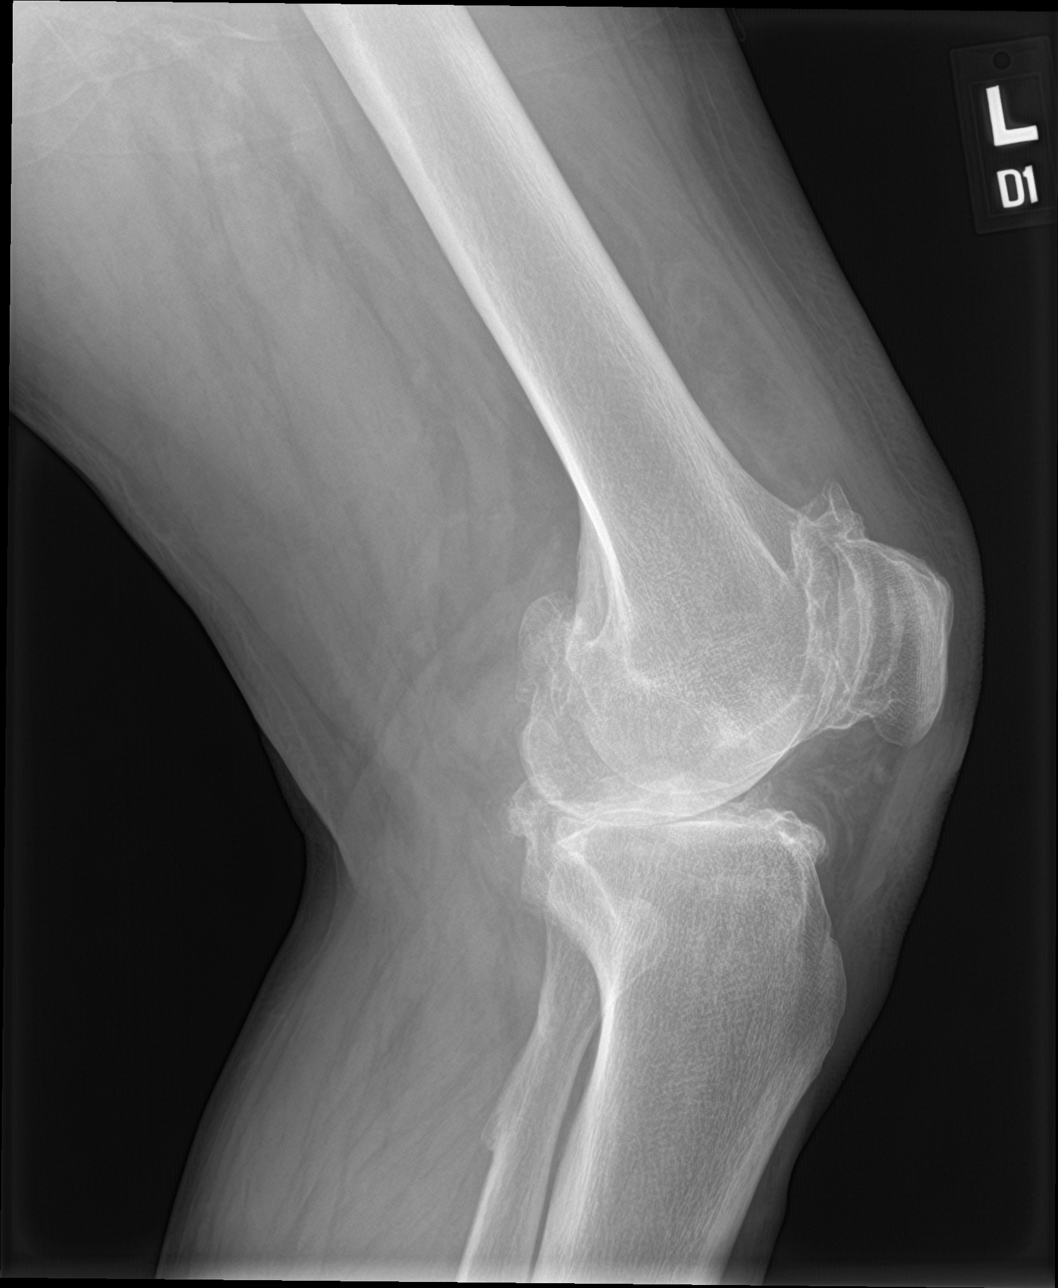

[sunrise]
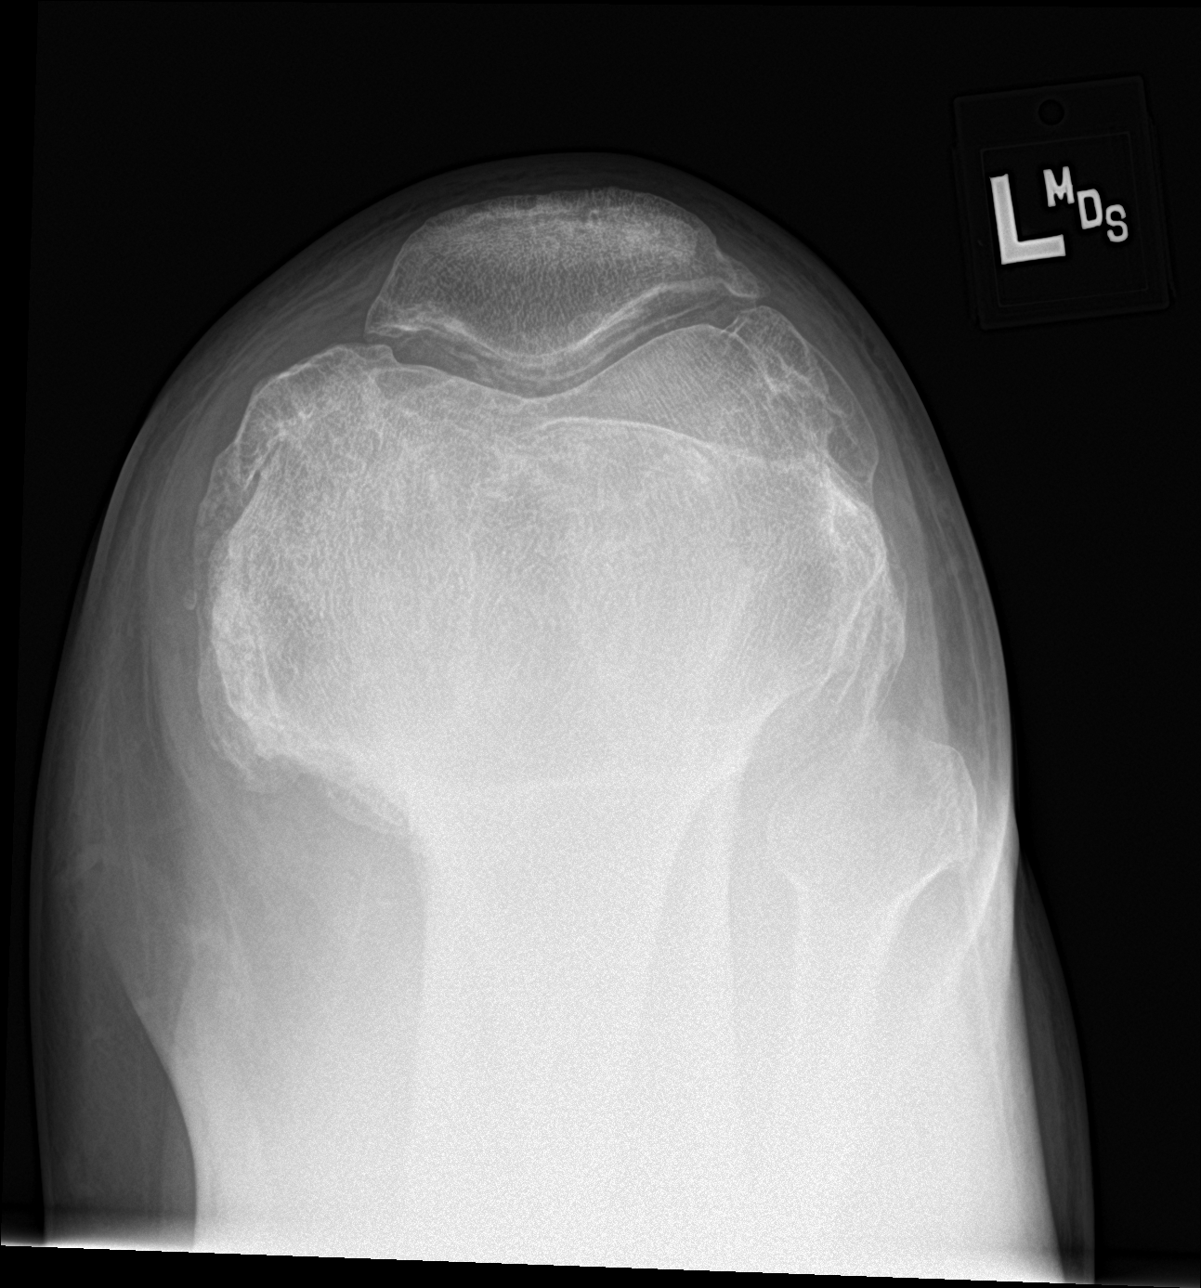

[knee ap (2 of 2)]
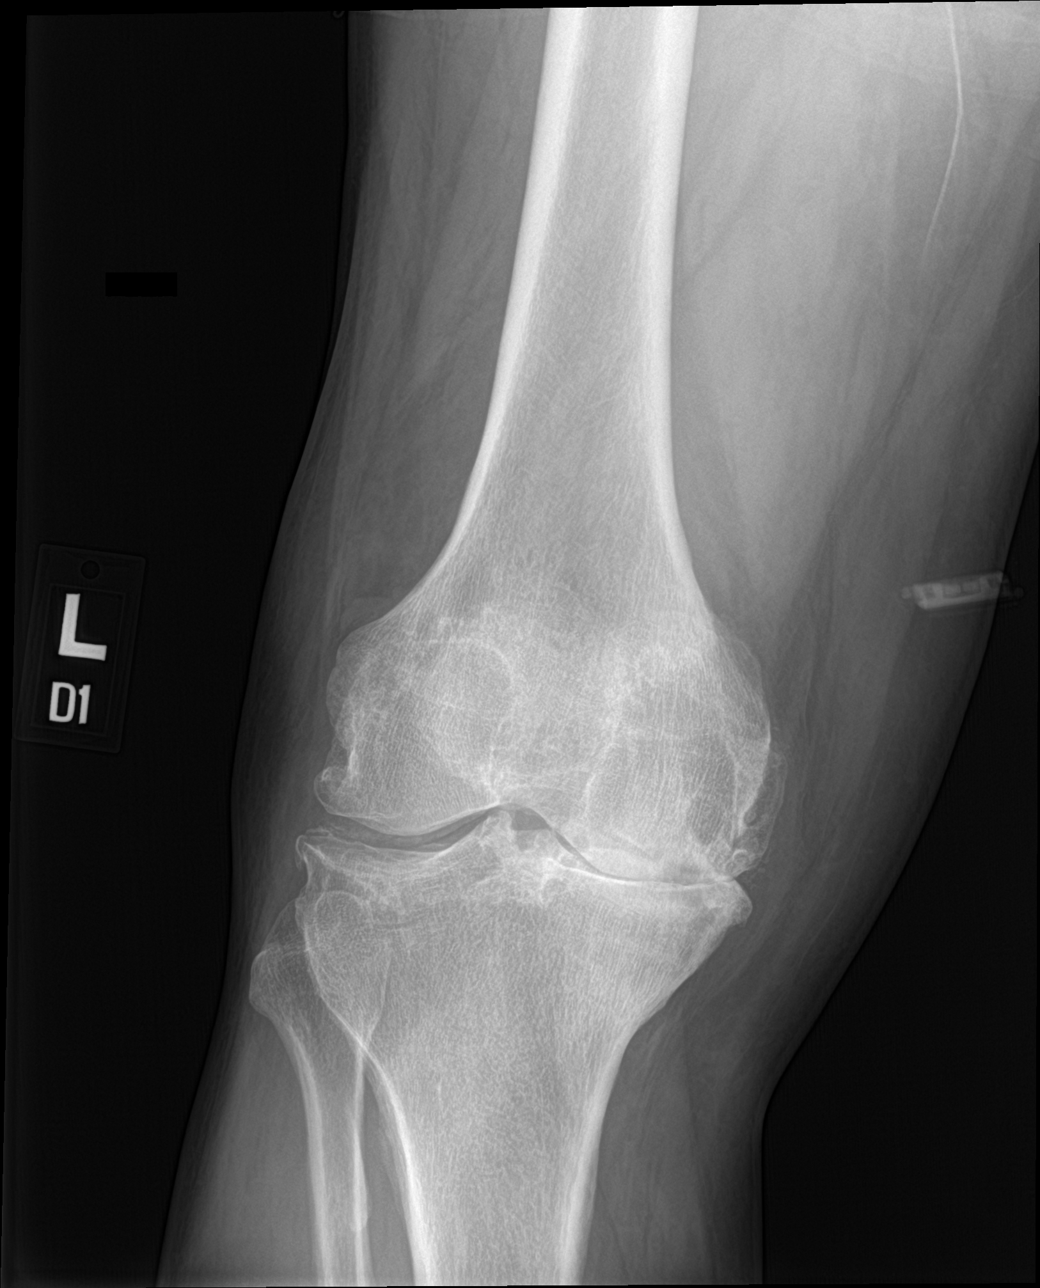

[4 of 4 positions shown; findings below may reference images not displayed]

FINDINGS: No evidence of fracture, dislocation, or joint effusion. Severe
narrowing of the medial joint space is noted with osteophyte
formation. Moderate narrowing of the lateral and patellofemoral
spaces is noted with osteophyte formation. Soft tissues are
unremarkable.
IMPRESSION: Severe tricompartmental osteoarthritis. No acute abnormality seen in
the left knee.

## 2022-07-18 ENCOUNTER — Ambulatory Visit
Admission: EM | Admit: 2022-07-18 | Discharge: 2022-07-18 | Disposition: A | Discharge status: Home / Self Care | Payer: Medicare Other | Attending: Nurse Practitioner | Admitting: Nurse Practitioner

## 2022-07-18 DIAGNOSIS — B349 Viral infection, unspecified: Secondary | ICD-10-CM | POA: Diagnosis not present

## 2022-07-18 DIAGNOSIS — R519 Headache, unspecified: Secondary | ICD-10-CM | POA: Insufficient documentation

## 2022-07-18 DIAGNOSIS — Z1152 Encounter for screening for COVID-19: Secondary | ICD-10-CM | POA: Diagnosis not present

## 2022-07-18 DIAGNOSIS — R059 Cough, unspecified: Secondary | ICD-10-CM | POA: Diagnosis present

## 2022-07-18 MED ORDER — OSELTAMIVIR PHOSPHATE 75 MG PO CAPS: 75.0000 mg | ORAL_CAPSULE | Freq: Two times a day (BID) | ORAL | 0 refills | Status: DC

## 2022-07-18 MED ORDER — PROMETHAZINE-DM 6.25-15 MG/5ML PO SYRP: 5.0000 mL | ORAL_SOLUTION | Freq: Every evening | ORAL | 0 refills | Status: DC | PRN

## 2022-07-18 MED ORDER — FLUTICASONE PROPIONATE 50 MCG/ACT NA SUSP: 2.0000 | Freq: Every day | NASAL | 0 refills | Status: AC

## 2022-07-18 NOTE — ED Triage Notes (Signed)
Cough, chills, headache, runny nose, body aches, that started yesterday. Took ibuprofen today.

## 2022-07-18 NOTE — ED Provider Notes (Signed)
[facility]    CSN: [id number] Arrival date & time: 07/18/22  1351      History   Chief Complaint Chief Complaint  Patient presents with   Cough   Nasal Congestion   Headache   Generalized Body Aches   Chills    HPI Randy Marsh is a 66 y.o. male.   The history is provided by the patient.   Patient presents with a 24-hour history of cough, chills, body aches, headache, runny nose, and nasal congestion.  Patient denies fever, sore throat, ear pain, wheezing, shortness of breath, difficulty breathing, or GI symptoms.  Patient reports that he took ibuprofen last evening.  He states he has not taken anything today.  He reports he has been vaccinated for COVID.  Patient denies any known sick contacts.  Past Medical History:  Diagnosis Date   Diabetes mellitus without complication (HCC)    Hypertension     There are no problems to display for this patient.   Past Surgical History:  Procedure Laterality Date   OTHER SURGICAL HISTORY     had clogged artery in right leg       Home Medications    Prior to Admission medications   Medication Sig Start Date End Date Taking? Authorizing Provider  amLODipine (NORVASC) 10 MG tablet Take 10 mg by mouth daily.   Yes [provider]  atorvastatin (LIPITOR) 40 MG tablet Take 40 mg by mouth daily.   Yes [provider]  Colchicine 0.6 MG CAPS Take 1 tablet by mouth daily. 08/26/21  Yes [provider]  esomeprazole (NEXIUM) 40 MG capsule Take 40 mg by mouth daily. 04/19/21  Yes [provider]  fluticasone (FLONASE) 50 MCG/ACT nasal spray Place 2 sprays into both nostrils daily. 07/18/22  Yes Kaydance Bowie-Warren, Sadie Haber, NP  lisinopril (ZESTRIL) 20 MG tablet Take 20 mg by mouth daily.   Yes [provider]  metFORMIN (GLUCOPHAGE-XR) 500 MG 24 hr tablet Take 500 mg by mouth daily. 06/26/21  Yes [provider]  oseltamivir (TAMIFLU) 75 MG capsule Take 1 capsule (75 mg  total) by mouth every 12 (twelve) hours. 07/18/22  Yes Anden Bartolo-Warren, Sadie Haber, NP  promethazine-dextromethorphan (PROMETHAZINE-DM) 6.25-15 MG/5ML syrup Take 5 mLs by mouth at bedtime as needed for cough. 07/18/22  Yes Jailey Booton-Warren, Sadie Haber, NP  tamsulosin (FLOMAX) 0.4 MG CAPS capsule Take 0.4 mg by mouth daily. 04/19/21  Yes [provider]  HYDROcodone-acetaminophen (NORCO) 5-325 MG tablet Take 1 tablet by mouth 3 (three) times daily as needed. Patient not taking: Reported on 09/13/2021 04/04/21   Menshew, Charlesetta Ivory, PA-C  oxyCODONE (OXY IR/ROXICODONE) 5 MG immediate release tablet Take 1 tablet by mouth every 8 (eight) hours. 07/18/21   [provider]  sildenafil (VIAGRA) 100 MG tablet Take by mouth. 09/19/21   [provider]    Family History Family History  Problem Relation Age of Onset   Cancer Father     Social History Social History   Tobacco Use   Smoking status: Former   Smokeless tobacco: Never  Building services engineer Use: Never used  Substance Use Topics   Alcohol use: Yes   Drug use: No     Allergies   Patient has no known allergies.   Review of Systems Review of Systems Per HPI  Physical Exam Triage Vital Signs ED Triage Vitals [07/18/22 1729]  Enc Vitals Group     BP 102/78     Pulse  Rate (!) 108     Resp 18     Temp 98.6 F (37 C)     Temp Source Oral     SpO2 96 %     Weight      Height      Head Circumference      Peak Flow      Pain Score 0     Pain Loc      Pain Edu?      Excl. in GC?    No data found.  Updated Vital Signs BP 102/78 (BP Location: Right Arm)   Pulse (!) 108   Temp 98.6 F (37 C) (Oral)   Resp 18   SpO2 96%   Visual Acuity Right Eye Distance:   Left Eye Distance:   Bilateral Distance:    Right Eye Near:   Left Eye Near:    Bilateral Near:     Physical Exam Vitals and nursing note reviewed.  Constitutional:      General: He is not in acute distress.    Appearance: He is  well-developed.  HENT:     Head: Normocephalic.     Right Ear: Tympanic membrane, ear canal and external ear normal.     Left Ear: Tympanic membrane, ear canal and external ear normal.     Nose: Congestion present. No rhinorrhea.     Mouth/Throat:     Mouth: Mucous membranes are moist.  Eyes:     Extraocular Movements: Extraocular movements intact.     Conjunctiva/sclera: Conjunctivae normal.     Pupils: Pupils are equal, round, and reactive to light.  Cardiovascular:     Rate and Rhythm: Regular rhythm.     Heart sounds: Normal heart sounds.  Pulmonary:     Effort: Pulmonary effort is normal. No respiratory distress.     Breath sounds: Normal breath sounds. No stridor. No wheezing, rhonchi or rales.  Abdominal:     General: Bowel sounds are normal.     Palpations: Abdomen is soft.     Tenderness: There is no abdominal tenderness.  Musculoskeletal:     Cervical back: Normal range of motion.  Lymphadenopathy:     Cervical: No cervical adenopathy.  Skin:    General: Skin is warm and dry.  Neurological:     General: No focal deficit present.     Mental Status: He is alert and oriented to person, place, and time.     GCS: GCS eye subscore is 4. GCS verbal subscore is 5. GCS motor subscore is 6.  Psychiatric:        Mood and Affect: Mood normal.        Speech: Speech normal.        Behavior: Behavior normal.      UC Treatments / Results  Labs (all labs ordered are listed, but only abnormal results are displayed) Labs Reviewed  SARS CORONAVIRUS 2 (TAT 6-24 HRS)    EKG   Radiology No results found.  Procedures Procedures (including critical care time)  Medications Ordered in UC Medications - No data to display  Initial Impression / Assessment and Plan / UC Course  I have reviewed the triage vital signs and the nursing notes.  Pertinent labs & imaging results that were available during my care of the patient were reviewed by me and considered in my medical  decision making (see chart for details).  The patient is well-appearing, he is in no acute distress, he is tachycardic, but vital signs  are otherwise stable.  Symptoms appear to be consistent with influenza.  Unable to perform testing due to limited supply and availability.  COVID test is pending.  Will start patient on Tamiflu at this time. Patient is a candidate to receive molnupiravir if his COVID test is positive.  You will need to stop the Tamiflu if his COVID test is positive.  Symptomatic treatment provided with Promethazine DM for nighttime cough, and fluticasone nasal spray.  Patient was advised to treat his body aches with Tylenol.  Patient also advised to increase fluids and allow for plenty of rest.  Care recommendations were provided to the patient.  Patient verbalizes understanding.  All questions were answered.  Patient is stable for discharge.   Final Clinical Impressions(s) / UC Diagnoses   Final diagnoses:  Viral illness     Discharge Instructions      COVID test is pending.  You will be contacted if the pending test results are positive.  If the COVID test is positive, you will need to stop the medication that was prescribed today.  You are a candidate to receive molnupiravir at that time. Increase fluids and allow for plenty rest. Recommend over-the-counter Tylenol to help with pain, fever, generalized discomfort. Recommend using a humidifier at bedtime during sleep and sleeping elevated on pillows while cough symptoms persist. To be advised that viral illnesses can last anywhere from 10 to 14 days.  If symptoms worsen before that timeframe or extend beyond that time, please follow-up with your primary care physician for further evaluation. Follow-up as needed.     ED Prescriptions     Medication Sig Dispense Auth. Provider   oseltamivir (TAMIFLU) 75 MG capsule Take 1 capsule (75 mg total) by mouth every 12 (twelve) hours. 10 capsule Monseratt Ledin-Warren, Sadie Haber, NP    promethazine-dextromethorphan (PROMETHAZINE-DM) 6.25-15 MG/5ML syrup Take 5 mLs by mouth at bedtime as needed for cough. 118 mL Jamye Balicki-Warren, Sadie Haber, NP   fluticasone (FLONASE) 50 MCG/ACT nasal spray Place 2 sprays into both nostrils daily. 16 g Takao Lizer-Warren, Sadie Haber, NP      PDMP not reviewed this encounter.   Abran Cantor, NP 07/18/22 [phone]

## 2022-07-18 NOTE — Discharge Instructions (Signed)
COVID test is pending.  You will be contacted if the pending test results are positive.  If the COVID test is positive, you will need to stop the medication that was prescribed today.  You are a candidate to receive molnupiravir at that time. Increase fluids and allow for plenty rest. Recommend over-the-counter Tylenol to help with pain, fever, generalized discomfort. Recommend using a humidifier at bedtime during sleep and sleeping elevated on pillows while cough symptoms persist. To be advised that viral illnesses can last anywhere from 10 to 14 days.  If symptoms worsen before that timeframe or extend beyond that time, please follow-up with your primary care physician for further evaluation. Follow-up as needed.

## 2022-07-19 LAB — SARS CORONAVIRUS 2 (TAT 6-24 HRS): SARS Coronavirus 2: NEGATIVE

## 2023-08-03 IMAGING — CT CT ABD-PELV W/ CM
2 of 5 series · 16 of 46 positions shown, 18 images · IV contrast (Omnipaque or Isovue)
Comparison: None.

CLINICAL DATA: Lower abdominal pain.

EXAM:
CT ABDOMEN AND PELVIS WITH CONTRAST
TECHNIQUE: Multidetector CT imaging of the abdomen and pelvis was performed
using the standard protocol following bolus administration of
intravenous contrast.

[Series 2: axial st · axial · 0.82mm/px · z∈[+660,+1050]mm · 13 of 88 slices shown, 15 images]
[im 5/88  soft-tissue]
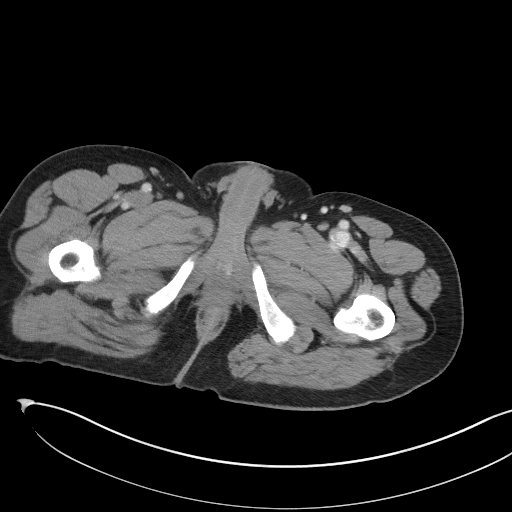
[im 5/88  bone]
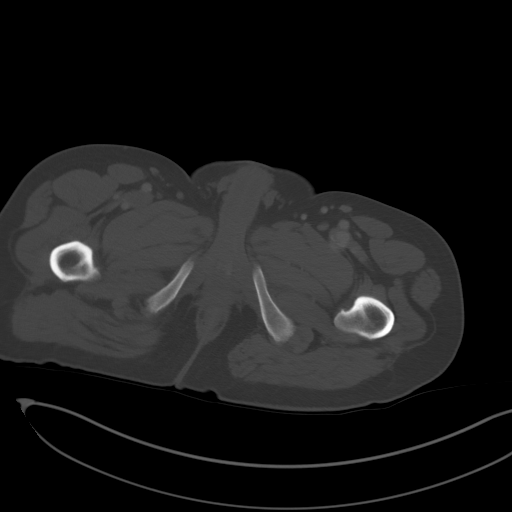
[im 10/88  soft-tissue]
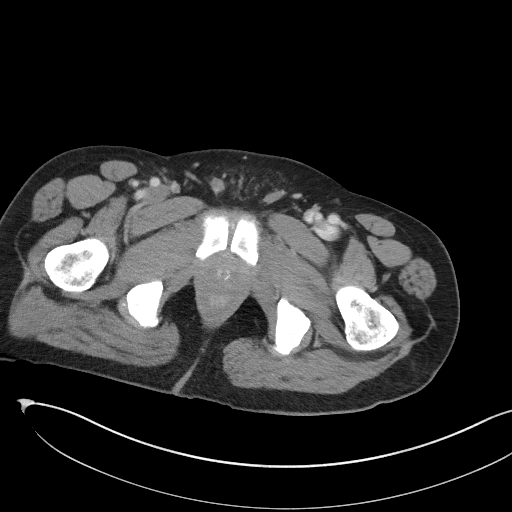
[im 20/88  soft-tissue]
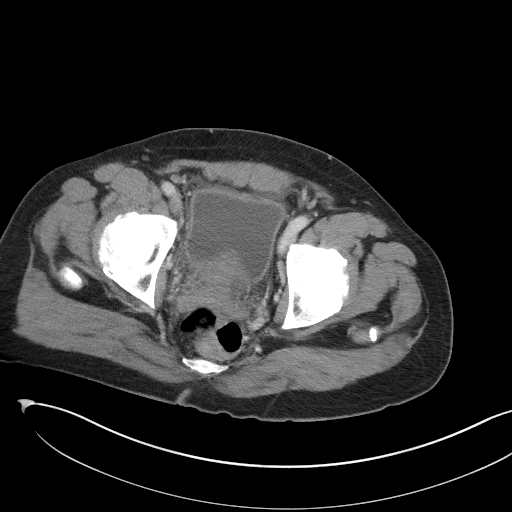
[im 25/88  soft-tissue]
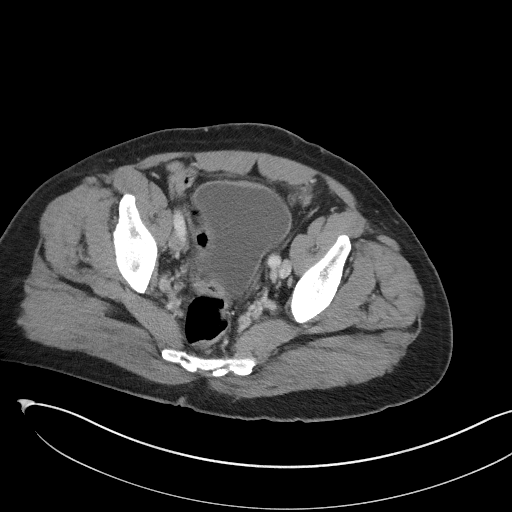
[im 30/88  soft-tissue]
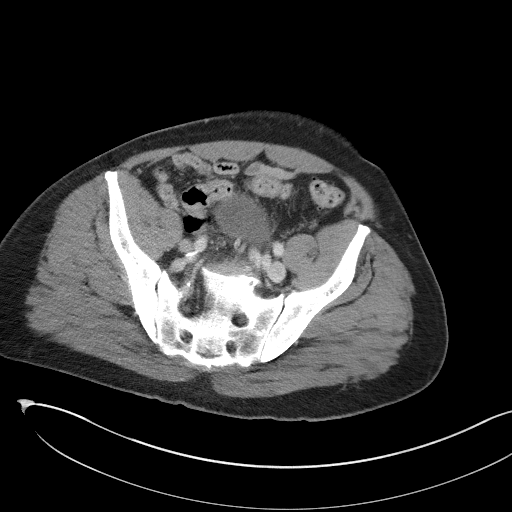
[im 39/88  soft-tissue]
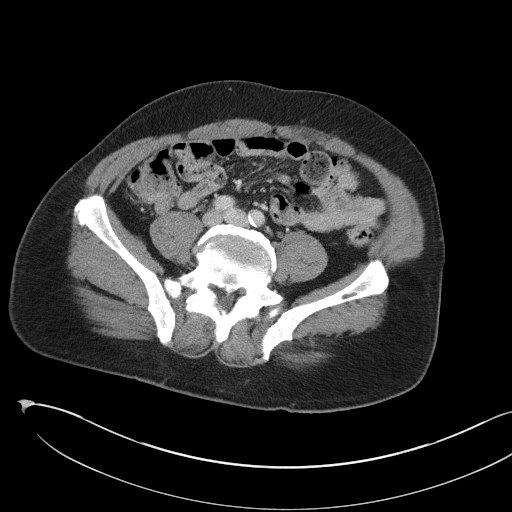
[im 44/88  soft-tissue]
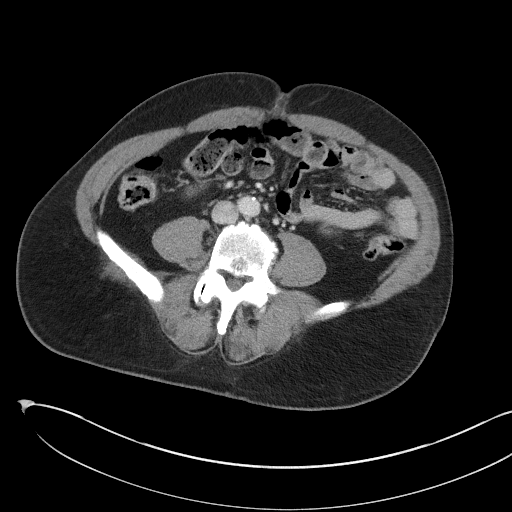
[im 49/88  soft-tissue]
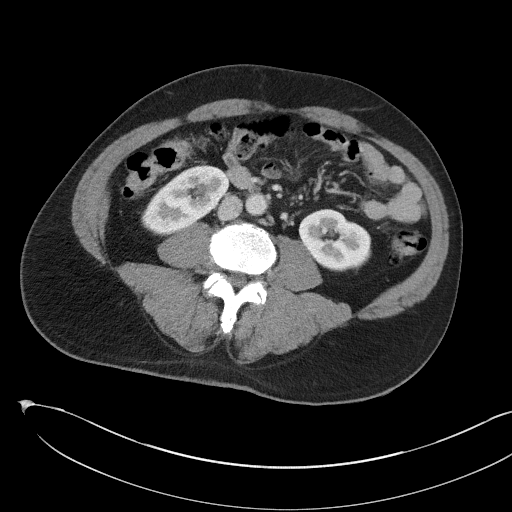
[im 59/88  soft-tissue]
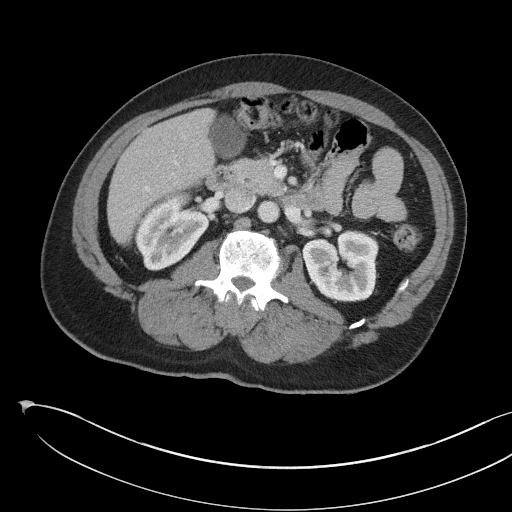
[im 59/88  bone]
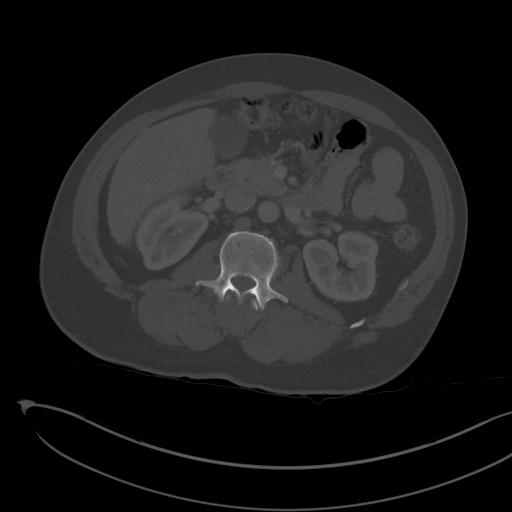
[im 63/88  soft-tissue]
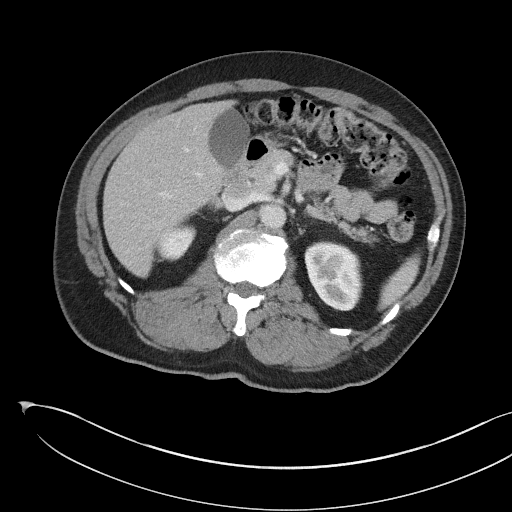
[im 68/88  soft-tissue]
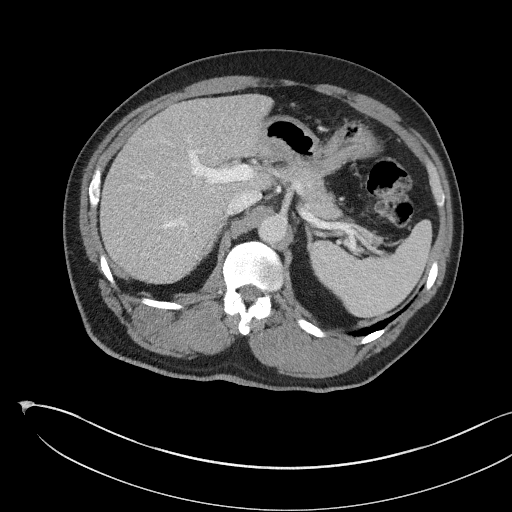
[im 78/88  soft-tissue]
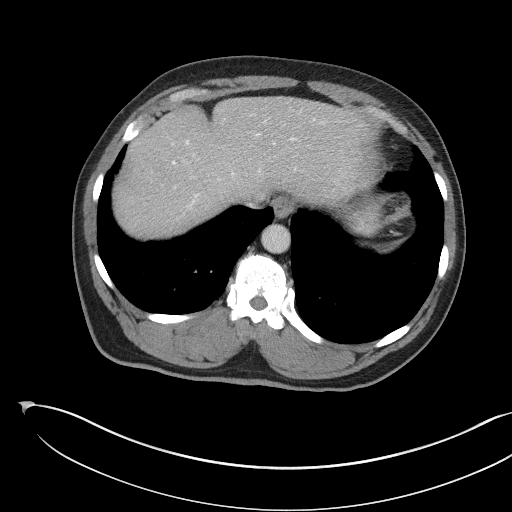
[im 83/88  soft-tissue]
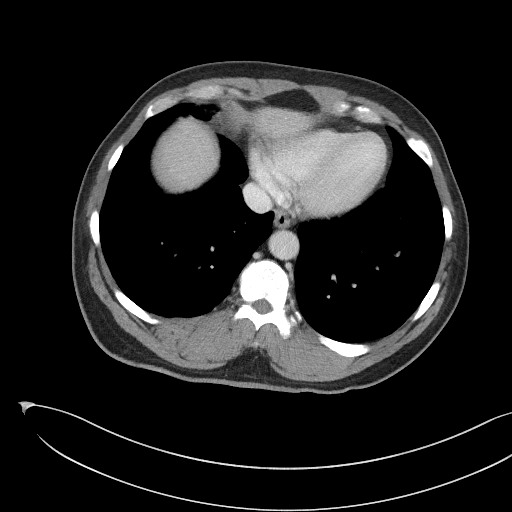

[Series 5: coronal st · coronal · 0.80mm/px · 3 of 105 slices shown]
[im 35/105  soft-tissue]
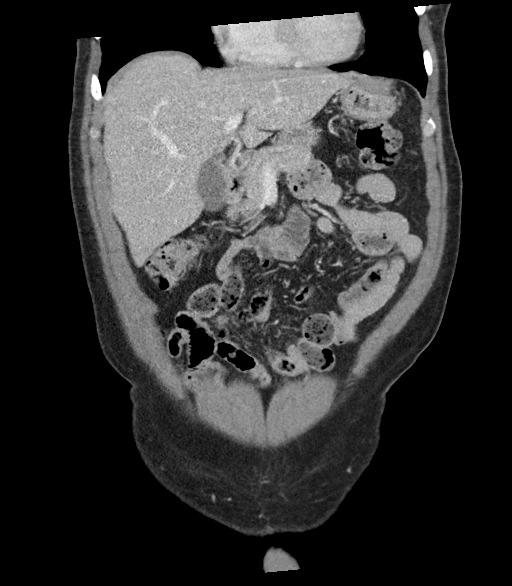
[im 47/105  soft-tissue]
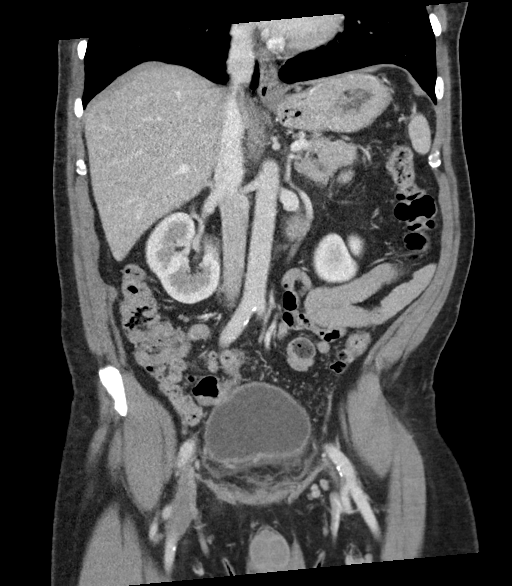
[im 58/105  soft-tissue]
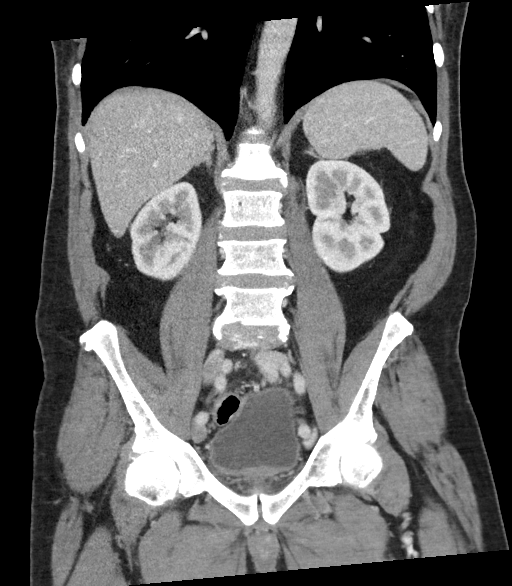

[16 of 46 positions shown; findings below may reference images not displayed]

RADIATION DOSE REDUCTION: This exam was performed according to the
departmental dose-optimization program which includes automated
exposure control, adjustment of the mA and/or kV according to
patient size and/or use of iterative reconstruction technique.

CONTRAST:  100mL OMNIPAQUE IOHEXOL 300 MG/ML  SOLN
FINDINGS: Lower chest: Clear lung bases.

Hepatobiliary: No focal liver abnormality is seen. No gallstones,
gallbladder wall thickening, or biliary dilatation.

Pancreas: Unremarkable.

Spleen: Unremarkable.

Adrenals/Urinary Tract: Unremarkable adrenal glands. No renal
calculi or hydronephrosis. 1.3 cm left renal cyst. Unremarkable
bladder.

Stomach/Bowel: The stomach is unremarkable. There is no evidence of
bowel obstruction. Left-sided colonic diverticulosis is noted
without evidence of acute diverticulitis. The appendix is
unremarkable.

Vascular/Lymphatic: Mild abdominal aortic atherosclerosis without
aneurysm. No enlarged lymph nodes.

Reproductive: Moderately enlarged prostate gland with edematous
appearance and surrounding inflammatory stranding. No abscess
identified.

Other: No ascites or pneumoperitoneum.

Musculoskeletal: Advanced lower lumbar facet arthrosis with trace
anterolisthesis of L4 on L5.
IMPRESSION: 1. Enlarged/edematous prostate gland with surrounding stranding
suggesting acute prostatitis. No abscess.
2. Colonic diverticulosis.
3. Aortic Atherosclerosis ([id number]).

## 2024-04-14 ENCOUNTER — Emergency Department
Admission: EM | Admit: 2024-04-14 | Discharge: 2024-04-14 | Disposition: A | Discharge status: Home / Self Care | Attending: Emergency Medicine | Admitting: Emergency Medicine

## 2024-04-14 ENCOUNTER — Emergency Department

## 2024-04-14 ENCOUNTER — Other Ambulatory Visit: Payer: Self-pay

## 2024-04-14 DIAGNOSIS — R519 Headache, unspecified: Secondary | ICD-10-CM | POA: Diagnosis not present

## 2024-04-14 DIAGNOSIS — I10 Essential (primary) hypertension: Secondary | ICD-10-CM | POA: Diagnosis not present

## 2024-04-14 DIAGNOSIS — E119 Type 2 diabetes mellitus without complications: Secondary | ICD-10-CM | POA: Diagnosis not present

## 2024-04-14 DIAGNOSIS — I669 Occlusion and stenosis of unspecified cerebral artery: Secondary | ICD-10-CM | POA: Insufficient documentation

## 2024-04-14 DIAGNOSIS — M545 Low back pain, unspecified: Secondary | ICD-10-CM | POA: Insufficient documentation

## 2024-04-14 DIAGNOSIS — E876 Hypokalemia: Secondary | ICD-10-CM | POA: Diagnosis not present

## 2024-04-14 DIAGNOSIS — M5030 Other cervical disc degeneration, unspecified cervical region: Secondary | ICD-10-CM | POA: Insufficient documentation

## 2024-04-14 DIAGNOSIS — M791 Myalgia, unspecified site: Secondary | ICD-10-CM | POA: Insufficient documentation

## 2024-04-14 DIAGNOSIS — M542 Cervicalgia: Secondary | ICD-10-CM | POA: Diagnosis present

## 2024-04-14 DIAGNOSIS — R52 Pain, unspecified: Secondary | ICD-10-CM

## 2024-04-14 LAB — COMPREHENSIVE METABOLIC PANEL WITH GFR
ALT: 16 U/L (ref 0–44)
AST: 12 U/L — ABNORMAL LOW (ref 15–41)
Albumin: 2.9 g/dL — ABNORMAL LOW (ref 3.5–5.0)
Alkaline Phosphatase: 50 U/L (ref 38–126)
Anion gap: 8 (ref 5–15)
BUN: 13 mg/dL (ref 8–23)
CO2: 18 mmol/L — ABNORMAL LOW (ref 22–32)
Calcium: 6.9 mg/dL — ABNORMAL LOW (ref 8.9–10.3)
Chloride: 114 mmol/L — ABNORMAL HIGH (ref 98–111)
Creatinine, Ser: 0.5 mg/dL — ABNORMAL LOW (ref 0.61–1.24)
GFR, Estimated: >60 mL/min (ref >60)
Glucose, Bld: 79 mg/dL (ref 70–99)
Potassium: 2.8 mmol/L — ABNORMAL LOW (ref 3.5–5.1)
Sodium: 140 mmol/L (ref 135–145)
Total Bilirubin: 1 mg/dL (ref 0.0–1.2)
Total Protein: 6.1 g/dL — ABNORMAL LOW (ref 6.5–8.1)

## 2024-04-14 LAB — CBC WITH DIFFERENTIAL/PLATELET
Abs Immature Granulocytes: 0.05 K/uL (ref 0.00–0.07)
Basophils Absolute: 0 K/uL (ref 0.0–0.1)
Basophils Relative: 0 %
Eosinophils Absolute: 0.1 K/uL (ref 0.0–0.5)
Eosinophils Relative: 1 %
HCT: 47.9 % (ref 39.0–52.0)
Hemoglobin: 15 g/dL (ref 13.0–17.0)
Immature Granulocytes: 0 %
Lymphocytes Relative: 21 %
Lymphs Abs: 2.5 K/uL (ref 0.7–4.0)
MCH: 22.7 pg — ABNORMAL LOW (ref 26.0–34.0)
MCHC: 31.3 g/dL (ref 30.0–36.0)
MCV: 72.5 fL — ABNORMAL LOW (ref 80.0–100.0)
Monocytes Absolute: 1.3 K/uL — ABNORMAL HIGH (ref 0.1–1.0)
Monocytes Relative: 10 %
Neutro Abs: 8.2 K/uL — ABNORMAL HIGH (ref 1.7–7.7)
Neutrophils Relative %: 68 %
Platelets: 224 K/uL (ref 150–400)
RBC: 6.61 MIL/uL — ABNORMAL HIGH (ref 4.22–5.81)
RDW: 16.5 % — ABNORMAL HIGH (ref 11.5–15.5)
WBC: 12.2 K/uL — ABNORMAL HIGH (ref 4.0–10.5)
nRBC: 0 % (ref 0.0–0.2)

## 2024-04-14 LAB — URINALYSIS, ROUTINE W REFLEX MICROSCOPIC
Bacteria, UA: NONE SEEN
Bilirubin Urine: NEGATIVE
Glucose, UA: >=500 mg/dL — AB
Hgb urine dipstick: NEGATIVE
Ketones, ur: 20 mg/dL — AB
Leukocytes,Ua: NEGATIVE
Nitrite: NEGATIVE
Protein, ur: NEGATIVE mg/dL
Specific Gravity, Urine: 1.045 — ABNORMAL HIGH (ref 1.005–1.030)
pH: 5 (ref 5.0–8.0)

## 2024-04-14 LAB — CK: Total CK: 74 U/L (ref 49–397)

## 2024-04-14 LAB — TROPONIN I (HIGH SENSITIVITY)
Troponin I (High Sensitivity): 4 ng/L (ref <18)
Troponin I (High Sensitivity): 4 ng/L (ref <18)

## 2024-04-14 MED ORDER — POTASSIUM CHLORIDE CRYS ER 20 MEQ PO TBCR: 20.0000 meq | EXTENDED_RELEASE_TABLET | Freq: Two times a day (BID) | ORAL | 0 refills | Status: AC

## 2024-04-14 MED ORDER — POTASSIUM CHLORIDE 20 MEQ PO PACK
40.0000 meq | PACK | Freq: Once | ORAL | Status: AC
  Administered 2024-04-14: 40 meq via ORAL
  Filled 2024-04-14 (×2): qty 2

## 2024-04-14 MED ORDER — OXYCODONE HCL 5 MG PO TABS: 5.0000 mg | ORAL_TABLET | Freq: Three times a day (TID) | ORAL | 0 refills | Status: AC | PRN

## 2024-04-14 MED ORDER — IOHEXOL 350 MG/ML SOLN
150.0000 mL | Freq: Once | INTRAVENOUS | Status: AC | PRN
  Administered 2024-04-14: 100 mL via INTRAVENOUS

## 2024-04-14 MED ORDER — PREDNISONE 10 MG (21) PO TBPK: ORAL_TABLET | ORAL | 0 refills | Status: AC

## 2024-04-14 MED ORDER — MORPHINE SULFATE (PF) 4 MG/ML IV SOLN
4.0000 mg | Freq: Once | INTRAVENOUS | Status: AC
  Administered 2024-04-14: 4 mg via INTRAVENOUS
  Filled 2024-04-14: qty 1

## 2024-04-14 MED ORDER — HYDROMORPHONE HCL 1 MG/ML IJ SOLN
1.0000 mg | Freq: Once | INTRAMUSCULAR | Status: AC
  Administered 2024-04-14: 1 mg via INTRAVENOUS
  Filled 2024-04-14: qty 1

## 2024-04-14 MED ORDER — ONDANSETRON HCL 4 MG/2ML IJ SOLN
4.0000 mg | Freq: Once | INTRAMUSCULAR | Status: AC
  Administered 2024-04-14: 4 mg via INTRAVENOUS
  Filled 2024-04-14: qty 2

## 2024-04-14 MED ORDER — LACTATED RINGERS IV BOLUS
1000.0000 mL | Freq: Once | INTRAVENOUS | Status: AC
  Administered 2024-04-14: 1000 mL via INTRAVENOUS

## 2024-04-14 MED ORDER — DEXAMETHASONE SODIUM PHOSPHATE 10 MG/ML IJ SOLN
10.0000 mg | Freq: Once | INTRAMUSCULAR | Status: AC
  Administered 2024-04-14: 10 mg via INTRAVENOUS
  Filled 2024-04-14: qty 1

## 2024-04-14 NOTE — ED Triage Notes (Signed)
 Pt reports neck and back pain for the past few days after sleeping on the couch.

## 2024-04-14 NOTE — ED Notes (Signed)
 Pt also reports centralized CP, dizziness and head pressure on right side.

## 2024-04-14 NOTE — ED Provider Notes (Signed)
 [facility] Provider Note    Event Date/Time   First MD Initiated Contact with Patient 04/14/24 1111     (approximate)   History   Back Pain, Chest Pain, and Headache   HPI  Randy Marsh is a 68 y.o. male history of hypertension, diabetes presents emergency department complaining of headache, neck pain, chest pain on the right side, lower back pain all the right side.  His sister is with him and states she does not believe it is from sitting on the couch.  States he had some slurred speech and family has a strong history of brain aneurysms.  Patient has a history of an MI.  States he is prediabetic and has been trying to lose weight.  He denies shortness of breath at this time.  Is mostly complaining of pain in worse with movement.  No numbness or tingling.      Physical Exam   Triage Vital Signs: ED Triage Vitals [04/14/24 1049]  Encounter Vitals Group     BP (!) 146/100     Girls Systolic BP Percentile      Girls Diastolic BP Percentile      Boys Systolic BP Percentile      Boys Diastolic BP Percentile      Pulse Rate 98     Resp 18     Temp 97.7 F (36.5 C)     Temp Source Oral     SpO2 100 %     Weight 195 lb (88.5 kg)     Height 5' 6 (1.676 m)     Head Circumference      Peak Flow      Pain Score 9     Pain Loc      Pain Education      Exclude from Growth Chart     Most recent vital signs: Vitals:   04/14/24 1615 04/14/24 1629  BP:  (!) 150/89  Pulse: 98 (!) 101  Resp: 15 16  Temp:  98 F (36.7 C)  SpO2: 96% 95%     General: Awake, no distress.   CV:  Good peripheral perfusion. regular rate and  rhythm Resp:  Normal effort. Lungs cta Abd:  No distention.  Tender right upper and right lower quadrant, wraps along the right flank into his back Other:  PERRL, patient keeps holding his head due to pain, neck is tender, musculature in the shoulders is tender, T-spine tender, lumbar spine is not tender but the flank area around  to the back and the musculature is tender, neurovascular intact.  Patient is able to answer all questions at this time no slurred speech noted   ED Results / Procedures / Treatments   Labs (all labs ordered are listed, but only abnormal results are displayed) Labs Reviewed  COMPREHENSIVE METABOLIC PANEL WITH GFR - Abnormal; Notable for the following components:      Result Value   Potassium 2.8 (*)    Chloride 114 (*)    CO2 18 (*)    Creatinine, Ser 0.50 (*)    Calcium 6.9 (*)    Total Protein 6.1 (*)    Albumin 2.9 (*)    AST 12 (*)    All other components within normal limits  CBC WITH DIFFERENTIAL/PLATELET - Abnormal; Notable for the following components:   WBC 12.2 (*)    RBC 6.61 (*)    MCV 72.5 (*)    MCH 22.7 (*)    RDW 16.5 (*)  Neutro Abs 8.2 (*)    Monocytes Absolute 1.3 (*)    All other components within normal limits  URINALYSIS, ROUTINE W REFLEX MICROSCOPIC - Abnormal; Notable for the following components:   Color, Urine YELLOW (*)    APPearance CLEAR (*)    Specific Gravity, Urine 1.045 (*)    Glucose, UA >=500 (*)    Ketones, ur 20 (*)    All other components within normal limits  CK  TROPONIN I (HIGH SENSITIVITY)  TROPONIN I (HIGH SENSITIVITY)     EKG  EKG   RADIOLOGY Chest x-ray, CT    PROCEDURES:   .Critical Care  Performed by: Gasper Devere ORN, PA-C Authorized by: Gasper Devere ORN, PA-C   Critical care provider statement:    Critical care time (minutes):  30   Critical care was necessary to treat or prevent imminent or life-threatening deterioration of the following conditions:  Metabolic crisis   Critical care was time spent personally by me on the following activities:  Development of treatment plan with patient or surrogate, discussions with consultants, evaluation of patient's response to treatment, examination of patient, ordering and review of laboratory studies, ordering and review of radiographic studies, ordering and performing  treatments and interventions, pulse oximetry, re-evaluation of patient's condition and review of old charts   I assumed direction of critical care for this patient from another provider in my specialty: no     Critical Care:  no Chief Complaint  Patient presents with   Back Pain   Chest Pain   Headache      MEDICATIONS ORDERED IN ED: Medications  morphine  (PF) 4 MG/ML injection 4 mg (4 mg Intravenous Given 04/14/24 1227)  ondansetron  (ZOFRAN ) injection 4 mg (4 mg Intravenous Given 04/14/24 1227)  potassium chloride  (KLOR-CON ) packet 40 mEq (40 mEq Oral Given 04/14/24 1653)  HYDROmorphone  (DILAUDID ) injection 1 mg (1 mg Intravenous Given 04/14/24 1422)  lactated ringers  bolus 1,000 mL (1,000 mLs Intravenous New Bag/Given 04/14/24 1422)  iohexol  (OMNIPAQUE ) 350 MG/ML injection 150 mL (100 mLs Intravenous Contrast Given 04/14/24 1454)  dexamethasone  (DECADRON ) injection 10 mg (10 mg Intravenous Given 04/14/24 1701)     IMPRESSION / MDM / ASSESSMENT AND PLAN / ED COURSE  I reviewed the triage vital signs and the nursing notes.                              Differential diagnosis includes, but is not limited to, subdural, SAH, aneurysm, CVA, C-spine radiculopathy muscle spasms, rhabdomyolysis acute cholecystitis, acute appendicitis, DKA  Patient's presentation is most consistent with acute presentation with potential threat to life or bodily function.   Medications given morphine  4 mg IV, Zofran  4 mg IV  The patient's slurred speech and a history of insulin-dependent diabetes I have concerns of CVA with his past medical history of hypertension, diabetes, and MI.  Will go ahead and do CT of the head.  If negative will assess after pain medication see if patient is feeling better.  Also will order labs, EKG, chest x-ray.  Will consider aneurysm if pain is not improving.  Also concerns for rhabdo due to all the muscle pain and patient has not been up and about recently.  EKG normal sinus  rhythm, see physician read, interpret as normal Labs are reassuring except for potassium is decreased at 2.5 indicating hypokalemia  CT head and cervical spine independently reviewed interpreted by me as being negative for any acute  abnormality, does show degenerative changes  Due to concerns of family history of aneurysms along with severe headache patient was willing to undergo CTA of the head, neck, chest abdomen pelvis  No acute abnormality noted on the CTs, these were independently reviewed and interpreted by me.  There is narrowing of the P2 cerebral artery so feel that he should follow-up with neurosurgery.  Due to the patient having low potassium he was given potassium 40 mEq p.o., Decadron  10 mg IV for the chronic neck pain, he was still continuing to complain of pain after morphine  and Zofran  so we did give him Dilaudid  1 mg IV ED.  He was given 1 L of lactated Ringer 's for rehydration.  All of these medications did finally help with his pain.  He is feeling much better.  I do feel a lot of his body aches and pain were due to the low potassium.  Will start him on outpatient oral potassium, steroid pack to start tomorrow for the neck pain.  And oxycodone  for pain if needed.  He is to follow-up with his regular doctor next week to have his potassium rechecked.  Follow-up with neurosurgery just for evaluation of the stenosis of the P2 to see if they need to follow it or see if there is any treatment.  He and his family are in agreement treatment plan.  Explained everything to the family.  He was discharged stable condition.  Since everything basically appears good I do not see any red flags to warrant admission at this time.  He is in agreement and does not want to go home.     FINAL CLINICAL IMPRESSION(S) / ED DIAGNOSES   Final diagnoses:  Hypokalemia  Degenerative disc disease, cervical  Stenosis of cerebral artery  Body aches     Rx / DC Orders   ED Discharge Orders           Ordered    predniSONE  (STERAPRED UNI-PAK 21 TAB) 10 MG (21) TBPK tablet        04/14/24 1656    potassium chloride  SA (KLOR-CON  M) 20 MEQ tablet  2 times daily        04/14/24 1656    oxyCODONE  (ROXICODONE ) 5 MG immediate release tablet  Every 8 hours PRN        04/14/24 1656             Note:  This document was prepared using Dragon voice recognition software and may include unintentional dictation errors.    Gasper Devere ORN, PA-C 04/14/24 CELESTER Floy Roberts, MD 04/14/24 [phone]

## 2024-04-14 NOTE — Discharge Instructions (Signed)
 Follow-up with your regular doctor.  Have them refer you to a neurosurgeon if they feel you need to be seen.  Dr. Precious neurosurgeon in town but he mostly does backs.  However he could refer you to someone at [facility] or [place].  Most likely they will do nothing with the narrowing of the artery at the posterior of your head. Return emergency department worsening Take your medications as prescribed I would like to regular doctor to recheck your potassium next week

## 2024-05-04 NOTE — Progress Notes (Deleted)
 Referring Physician:  Henry Fitch, MD [address] [place],  [place] [address]  Primary Physician:  Henry Fitch, MD  History of Present Illness: 05/04/2024*** [name] has a history of HTN, DM, MI,   Seen in ED on 04/14/24 and was treated for hypokalemia. 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  Neck pain Arm weakness or arm pain?  Duration: *** Location: *** Quality: *** Severity: ***  Precipitating: aggravated by *** Modifying factors: made better by *** Weakness: none Timing: ***  Tobacco use: smokes *** PPD x years. Does not smoke.   Bowel/Bladder Dysfunction: none  Conservative measures:  Physical therapy: *** has not participated in Multimodal medical therapy including regular antiinflammatories: *** Prednisone , Oxycodone  Injections: *** no epidural steroid injections  Past Surgery: ***no spine surgery  Randy Marsh has ***no symptoms of cervical myelopathy.  The symptoms are causing a significant impact on the patient's life.   Review of Systems:  A 10 point review of systems is negative, except for the pertinent positives and negatives detailed in the HPI.  Past Medical History: Past Medical History:  Diagnosis Date   Diabetes mellitus without complication (HCC)    Hypertension     Past Surgical History: Past Surgical History:  Procedure Laterality Date   OTHER SURGICAL HISTORY     had clogged artery in right leg    Allergies: Allergies as of 05/10/2024   (No Known Allergies)    Medications: Outpatient Encounter Medications as of 05/10/2024  Medication Sig   amLODipine (NORVASC) 10 MG tablet Take 10 mg by mouth daily.   atorvastatin (LIPITOR) 40 MG tablet Take 40 mg by mouth daily.   Colchicine  0.6 MG CAPS Take 1 tablet by mouth daily.   esomeprazole (NEXIUM) 40 MG capsule Take 40 mg by mouth daily.   fluticasone  (FLONASE ) 50 MCG/ACT  nasal spray Place 2 sprays into both nostrils daily.   lisinopril (ZESTRIL) 20 MG tablet Take 20 mg by mouth daily.   metFORMIN (GLUCOPHAGE-XR) 500 MG 24 hr tablet Take 500 mg by mouth daily.   oxyCODONE  (ROXICODONE ) 5 MG immediate release tablet Take 1 tablet (5 mg total) by mouth every 8 (eight) hours as needed.   potassium chloride  SA (KLOR-CON  M) 20 MEQ tablet Take 1 tablet (20 mEq total) by mouth 2 (two) times daily.   predniSONE  (STERAPRED UNI-PAK 21 TAB) 10 MG (21) TBPK tablet Take 6 pills on day one then decrease by 1 pill each day   sildenafil (VIAGRA) 100 MG tablet Take by mouth.   tamsulosin (FLOMAX) 0.4 MG CAPS capsule Take 0.4 mg by mouth daily.   No facility-administered encounter medications on file as of 05/10/2024.    Social History: Social History   Tobacco Use   Smoking status: Former   Smokeless tobacco: Never  Advertising Account Planner   Vaping status: Never Used  Substance Use Topics   Alcohol use: Yes   Drug use: No    Family Medical History: Family History  Problem Relation Age of Onset   Cancer Father     Physical Examination: There were no vitals filed for this visit.  General: Patient is well developed, well nourished, calm, collected, and in no apparent distress. Attention to examination is appropriate.  Respiratory: Patient is breathing without any difficulty.   NEUROLOGICAL:     Awake, alert, oriented to person, place, and time.  Speech is clear and fluent. Fund of knowledge is appropriate.  Cranial Nerves: Pupils equal round and reactive to light.  Facial tone is symmetric.    *** ROM of cervical spine *** pain *** posterior cervical tenderness. *** tenderness in bilateral trapezial region.   *** ROM of lumbar spine *** pain *** posterior lumbar tenderness.   No abnormal lesions on exposed skin.   Strength: Side Biceps Triceps Deltoid Interossei Grip Wrist Ext. Wrist Flex.  R 5 5 5 5 5 5 5   L 5 5 5 5 5 5 5    Side Iliopsoas Quads Hamstring PF  DF EHL  R 5 5 5 5 5 5   L 5 5 5 5 5 5    Reflexes are ***2+ and symmetric at the biceps, brachioradialis, patella and achilles.   Hoffman's is absent.  Clonus is not present.   Bilateral upper and lower extremity sensation is intact to light touch.     Gait is normal.   ***No difficulty with tandem gait.    Medical Decision Making  Imaging: Cervical CT scan dated 04/14/24:  CT CERVICAL SPINE FINDINGS   Alignment: Straightening.  No substantial sagittal subluxation.   Skull base and vertebrae: No acute fracture.   Soft tissues and spinal canal: No prevertebral fluid or swelling. No visible canal hematoma.   Disc levels: Multilevel degenerative disc disease. Multilevel facet and field streak contributes to bring degrees neural foraminal stenosis, likely severe at multiple levels.   Upper chest: Lung apices are clear.   IMPRESSION: 1. No evidence of acute abnormality intracranially or in the cervical spine. 2. Multilevel degenerative change with likely severe foraminal stenosis at multiple levels. An MRI could better characterize if clinically warranted.     Electronically Signed   By: Gilmore GORMAN Molt M.D.   On: 04/14/2024 12:40     I have personally reviewed the images and agree with the above interpretation.  Assessment and Plan: [name] is a pleasant 68 y.o. male has ***  Treatment options discussed with patient and following plan made:   - Order for physical therapy for *** spine ***. Patient to call to schedule appointment. *** - Continue current medications including ***. Reviewed dosing and side effects.  - Prescription for ***. Reviewed dosing and side effects. Take with food.  - Prescription for *** to take prn muscle spasms. Reviewed dosing and side effects. Discussed this can cause drowsiness.  - MRI of *** to further evaluate *** radiculopathy. No improvement time or medications (***).  - Referral to PMR at [facility] to discuss possible *** injections.  -  Will schedule phone visit to review MRI results once I get them back.   I spent a total of *** minutes in face-to-face and non-face-to-face activities related to this patient's care today including review of outside records, review of imaging, review of symptoms, physical exam, discussion of differential diagnosis, discussion of treatment options, and documentation.   Thank you for involving me in the care of this patient.   Glade Boys PA-C Dept. of Neurosurgery

## 2024-05-10 ENCOUNTER — Ambulatory Visit: Admitting: Orthopedic Surgery

## 2024-05-12 ENCOUNTER — Ambulatory Visit: Admitting: Neuroradiology

## 2024-05-18 ENCOUNTER — Encounter: Payer: Self-pay | Admitting: Neuroradiology

## 2024-05-18 ENCOUNTER — Ambulatory Visit: Admitting: Neuroradiology

## 2024-05-18 VITALS — BP 112/77 | HR 97 | Ht 66.0 in | Wt 202.0 lb

## 2024-05-18 DIAGNOSIS — I6621 Occlusion and stenosis of right posterior cerebral artery: Secondary | ICD-10-CM | POA: Diagnosis not present

## 2024-05-18 DIAGNOSIS — M542 Cervicalgia: Secondary | ICD-10-CM

## 2024-05-18 DIAGNOSIS — I672 Cerebral atherosclerosis: Secondary | ICD-10-CM

## 2024-05-18 DIAGNOSIS — I10 Essential (primary) hypertension: Secondary | ICD-10-CM

## 2024-05-18 NOTE — Progress Notes (Signed)
 Chief Complaint: Patient was seen in consultation today for  Chief Complaint  Patient presents with   New Patient (Initial Visit)   at the request of Roberts,Caroline  Referring Physician(s): Roberts,Caroline  Randy Marsh is a 68 y.o. male seen for evaluation of intracranial atherosclerosis  Assessment and Plan Assessment & Plan Right posterior cerebral artery stenosis Chronic stenosis identified on CT arteriogram. Asymptomatic with no stroke or neurological deficits. No surgical or invasive intervention required. Management focuses on controlling risk factors. - Recommend daily low-dose aspirin. - Continue management of hypertension, prediabetes, and hyperlipidemia with primary care provider.  Neck pain Acute musculoskeletal neck pain, not vascular. Exacerbated by activity, relieved with Tylenol  and topical treatments. No neurological deficits.     Discussed the use of AI scribe software for clinical note transcription with the patient, who gave verbal consent to proceed.  History of Present Illness Randy Marsh is a 68 year old male who presents with neck pain. He was referred for evaluation of a blockage in an artery in the back part of the brain found during investigation of neck pain.  He experiences severe neck pain that began after spending the night at his granddaughter's house, waking up unable to move his neck due to pain. Initial self-treatment included using Bengay, Tylenol , and a hot water bottle, which provided some relief. 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  Due to the severity of the pain, which he described as causing him to cry, he called his daughter and niece for assistance. He was taken to [facility] for further evaluation, where imaging studies were conducted.  A head CT and CT arteriogram were performed on April 14, 2024, as part of his  evaluation.  No history of stroke or symptoms such as sudden weakness, vision loss, or speech difficulties. No known heart problems. He has a regular follow-up for his chronic conditions.  His current medications include treatments for high blood pressure, prediabetes, and high cholesterol. He occasionally takes a low-dose aspirin.    Past Medical History:  Diagnosis Date   Diabetes mellitus without complication (HCC)    Hypertension     Past Surgical History:  Procedure Laterality Date   OTHER SURGICAL HISTORY     had clogged artery in right leg    Allergies: Patient has no known allergies.  Medications: Prior to Admission medications   Medication Sig Start Date End Date Taking? Authorizing Provider  amLODipine (NORVASC) 10 MG tablet Take 10 mg by mouth daily.   Yes [provider]  atorvastatin (LIPITOR) 40 MG tablet Take 40 mg by mouth daily.   Yes [provider]  Colchicine  0.6 MG CAPS Take 1 tablet by mouth daily. 08/26/21  Yes [provider]  esomeprazole (NEXIUM) 40 MG capsule Take 40 mg by mouth daily. 04/19/21  Yes [provider]  fluticasone  (FLONASE ) 50 MCG/ACT nasal spray Place 2 sprays into both nostrils daily. 07/18/22  Yes Leath-Warren, Etta PARAS, NP  JARDIANCE 25 MG TABS tablet Take 25 mg by mouth daily. 04/28/24  Yes [provider]  lisinopril (ZESTRIL) 20 MG tablet Take 20 mg by mouth daily.   Yes [provider]  potassium chloride  SA (KLOR-CON  M) 20 MEQ tablet Take 1 tablet (20 mEq total) by mouth 2 (two) times daily. 04/14/24  Yes Fisher, Devere ORN, PA-C  predniSONE  (STERAPRED UNI-PAK 21 TAB) 10 MG (21) TBPK tablet Take 6 pills on  day one then decrease by 1 pill each day 04/14/24  Yes Fisher, Devere ORN, PA-C  sildenafil (VIAGRA) 100 MG tablet Take by mouth. 09/19/21  Yes [provider]  tamsulosin (FLOMAX) 0.4 MG CAPS capsule Take 0.4 mg by mouth daily. 04/19/21  Yes [provider]  metFORMIN  (GLUCOPHAGE-XR) 500 MG 24 hr tablet Take 500 mg by mouth daily. Patient not taking: Reported on 05/18/2024 06/26/21   [provider]  oxyCODONE  (ROXICODONE ) 5 MG immediate release tablet Take 1 tablet (5 mg total) by mouth every 8 (eight) hours as needed. Patient not taking: Reported on 05/18/2024 04/14/24   Gasper Devere ORN, PA-C     Family History  Problem Relation Age of Onset   Cancer Father     Social History   Socioeconomic History   Marital status: Divorced    Spouse name: Not on file   Number of children: Not on file   Years of education: Not on file   Highest education level: Not on file  Occupational History   Not on file  Tobacco Use   Smoking status: Former   Smokeless tobacco: Never  Vaping Use   Vaping status: Never Used  Substance and Sexual Activity   Alcohol use: Yes   Drug use: No   Sexual activity: Not on file  Other Topics Concern   Not on file  Social History Narrative   Not on file   Social Drivers of Health   Financial Resource Strain: Low Risk  (04/25/2021)   Received from [facility]   Overall Financial Resource Strain (CARDIA)    Difficulty of Paying Living Expenses: Not hard at all  Food Insecurity: No Food Insecurity (04/25/2021)   Received from [facility]   Hunger Vital Sign    Within the past 12 months, you worried that your food would run out before you got the money to buy more.: Never true    Within the past 12 months, the food you bought just didn't last and you didn't have money to get more.: Never true  Transportation Needs: No Transportation Needs (04/25/2021)   Received from [facility]   PRAPARE - Transportation    Lack of Transportation (Medical): No    Lack of Transportation (Non-Medical): No  Physical Activity: Not on file  Stress: Not on file  Social Connections: Not on file    Review of Systems  Respiratory:  Negative for chest tightness and shortness of breath.   Cardiovascular:  Negative for  chest pain.    Vital Signs:  BP 112/77 (BP Location: Left Arm, Patient Position: Sitting, Cuff Size: Normal)   Pulse 97   Ht 5' 6 (1.676 m)   Wt 202 lb (91.6 kg)   SpO2 99%   BMI 32.60 kg/m   Physical Exam CHEST: Clear to auscultation bilaterally. No wheezes, rhonchi, or crackles. CARDIOVASCULAR: Normal heart rate and rhythm. S1 and S2 normal without murmurs. NEUROLOGICAL: Cranial nerves grossly intact. Normal cerebellar function. Moves all extremities without gross motor or sensory deficit.  Imaging:  Results RADIOLOGY Head CT: Chronic white matter abnormalities, no hemorrhage or acute infarct (04/14/2024) CT arteriogram: No carotid artery stenosis, patent vertebral arteries, stenosis of right posterior cerebral artery (04/14/2024)  Labs:  CBC: Recent Labs    04/14/24 1155  WBC 12.2*  HGB 15.0  HCT 47.9  PLT 224    BMP: Recent Labs    04/14/24 1155  NA 140  K 2.8*  CL 114*  CO2  18*  GLUCOSE 79  BUN 13  CALCIUM 6.9*  CREATININE 0.50*  GFRNONAA >60    LIVER FUNCTION TESTS: Recent Labs    04/14/24 1155  BILITOT 1.0  AST 12*  ALT 16  ALKPHOS 50  PROT 6.1*  ALBUMIN 2.9*      Electronically Signed: Nancyann LULLA Burns  05/18/2024, 2:08 PM
